# Patient Record
Sex: Female | Born: 2000 | Race: Black or African American | Hispanic: No | Marital: Single | Smoking: Never smoker
Health system: Southern US, Community
[De-identification: ages and names within clinical notes are randomized; demographics above are authoritative.]

## PROBLEM LIST (undated history)

## (undated) DIAGNOSIS — S0300XA Dislocation of jaw, unspecified side, initial encounter: Secondary | ICD-10-CM

## (undated) DIAGNOSIS — L659 Nonscarring hair loss, unspecified: Secondary | ICD-10-CM

## (undated) DIAGNOSIS — D649 Anemia, unspecified: Secondary | ICD-10-CM

## (undated) DIAGNOSIS — G43909 Migraine, unspecified, not intractable, without status migrainosus: Secondary | ICD-10-CM

## (undated) DIAGNOSIS — A389 Scarlet fever, uncomplicated: Secondary | ICD-10-CM

## (undated) DIAGNOSIS — K219 Gastro-esophageal reflux disease without esophagitis: Secondary | ICD-10-CM

## (undated) HISTORY — PX: TONSILLECTOMY: SUR1361

## (undated) HISTORY — DX: Nonscarring hair loss, unspecified: L65.9

## (undated) HISTORY — DX: Migraine, unspecified, not intractable, without status migrainosus: G43.909

## (undated) HISTORY — DX: Scarlet fever, uncomplicated: A38.9

## (undated) HISTORY — DX: Dislocation of jaw, unspecified side, initial encounter: S03.00XA

## (undated) HISTORY — DX: Gastro-esophageal reflux disease without esophagitis: K21.9

---
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
## 2022-01-17 ENCOUNTER — Other Ambulatory Visit: Payer: Self-pay

## 2022-01-17 ENCOUNTER — Inpatient Hospital Stay (HOSPITAL_COMMUNITY)
Admission: AD | Admit: 2022-01-17 | Discharge: 2022-01-17 | Disposition: A | Discharge status: Home / Self Care | Payer: Medicaid Other | Attending: Obstetrics and Gynecology | Admitting: Obstetrics and Gynecology

## 2022-01-17 DIAGNOSIS — O98812 Other maternal infectious and parasitic diseases complicating pregnancy, second trimester: Secondary | ICD-10-CM | POA: Diagnosis present

## 2022-01-17 DIAGNOSIS — O26892 Other specified pregnancy related conditions, second trimester: Secondary | ICD-10-CM | POA: Diagnosis not present

## 2022-01-17 DIAGNOSIS — Z3A18 18 weeks gestation of pregnancy: Secondary | ICD-10-CM | POA: Insufficient documentation

## 2022-01-17 DIAGNOSIS — B3731 Acute candidiasis of vulva and vagina: Secondary | ICD-10-CM | POA: Insufficient documentation

## 2022-01-17 DIAGNOSIS — R300 Dysuria: Secondary | ICD-10-CM | POA: Diagnosis not present

## 2022-01-17 LAB — URINALYSIS, ROUTINE W REFLEX MICROSCOPIC
Bilirubin Urine: NEGATIVE
Glucose, UA: NEGATIVE mg/dL
Hgb urine dipstick: NEGATIVE
Ketones, ur: NEGATIVE mg/dL
Nitrite: NEGATIVE
Protein, ur: NEGATIVE mg/dL
Specific Gravity, Urine: 1.018 (ref 1.005–1.030)
pH: 7 (ref 5.0–8.0)

## 2022-01-17 LAB — WET PREP, GENITAL
Clue Cells Wet Prep HPF POC: NONE SEEN
Sperm: NONE SEEN
Trich, Wet Prep: NONE SEEN
WBC, Wet Prep HPF POC: >=10 — AB (ref <10)

## 2022-01-17 MED ORDER — TERCONAZOLE 0.4 % VA CREA: 1.0000 | TOPICAL_CREAM | Freq: Every day | VAGINAL | 0 refills | Status: DC

## 2022-01-17 NOTE — Discharge Instructions (Signed)
[facility]  ? ?[facility]             ?[address], [place], [place] [address] ?[phone] ? ?[facility]                                                             ?[address], [place], [place], [address] ?[phone] ? ?[facility]  [place]                                    ?[place], [place], [place], [place], [address] ?[phone] ? ?[facility] at [facility] ?[address] [place], [place], [place], [address] ?[phone] ? ?[facility] at [facility]                                 ?[place], [place], [place], [address] ?[phone] ? ?[facility]                                    ?[address], [place], [place], [address] ?[phone] ? ?[facility] at [facility] ?[address], [address], [place], [place], [address]                             ?

## 2022-01-17 NOTE — MAU Note (Signed)
Tricia Welch is a 21 y.o. at [redacted]w[redacted]d here in MAU reporting: c/o vaginal itching & discharge.  Reports symptoms began early this week on Tuesday.  Reports had +Chlamydia last month hasn't had TOC.  Also states having pain with urination. ? ?Onset of complaint: 3 days ago ?Pain score: 3 ?Vitals:  ? 01/17/22 1507  ?BP: 107/60  ?Pulse: 88  ?Temp: 97.9 ?F (36.6 ?C)  ?SpO2: 100%  ?   ?FHT:169 bpm, denies VB or LOF ?Lab orders placed from triage:   U/A ?

## 2022-01-17 NOTE — MAU Provider Note (Signed)
MAU Provider Note ? ?History  ?  ? ?CSN: [id number] ? ?Arrival date and time: 01/17/22 1433 ? ? ?Chief Complaint  ?Patient presents with  ? Vaginal Discharge  ? Vaginal Itching  ? ?Tricia Welch is an 21 year old female presenting for evaluation of vaginal discharge, itching, and dysuria. ? ?Symptoms have been occurring for approximately 1 week. Describes the dysuria as a "slight discomfort" whenever urine hits her vaginal wall.  Otherwise she denies any urinary frequency, urgency, CVA tenderness, abdominal pain, or fever.  Vaginal discharge is clear to white and increased amounts compared to previous.  She tested positive for chlamydia in January and has not had a test of cure, but was treated while in the MAU.  She has not been sexually active since that time. ? ?She also reports that she needs an OB/GYN provider.  She recently moved here from [place].  She denies any leakage of fluid, vaginal bleeding, or abdominal pain. ? ?No past medical history on file. ? ?Past Surgical History:  ?Procedure Laterality Date  ? TONSILLECTOMY    ? ? ?Allergies: No Known Allergies ? ?Review of Systems  ?Constitutional:  Negative for fatigue and fever.  ?Respiratory:  Negative for shortness of breath.   ?Cardiovascular:  Negative for chest pain.  ?Gastrointestinal:  Negative for abdominal pain, constipation, diarrhea, nausea and vomiting.  ?Genitourinary:  Positive for dysuria and vaginal discharge. Negative for decreased urine volume, difficulty urinating, flank pain, frequency, hematuria, pelvic pain and vaginal bleeding.  ?Musculoskeletal:  Negative for back pain.  ?Skin:  Negative for pallor and rash.  ?Neurological:  Negative for dizziness and light-headedness.  ?Psychiatric/Behavioral:  Negative for behavioral problems.   ?Physical Exam  ? ?Blood pressure 107/60, pulse 88, temperature 97.9 ?F (36.6 ?C), temperature source Oral, height 5\' 8"  (1.727 m), weight 66.9 kg, last menstrual period 09/08/2021, SpO2 100 %. ? ?Physical  Exam ?Constitutional:   ?   General: She is not in acute distress. ?   Appearance: Normal appearance. She is not ill-appearing or diaphoretic.  ?HENT:  ?   Nose: Nose normal.  ?   Mouth/Throat:  ?   Mouth: Mucous membranes are moist.  ?Eyes:  ?   Extraocular Movements: Extraocular movements intact.  ?Cardiovascular:  ?   Rate and Rhythm: Normal rate.  ?Pulmonary:  ?   Effort: Pulmonary effort is normal.  ?Abdominal:  ?   Comments: Soft, nondistended, nontender to palpation throughout.  No suprapubic tenderness.  Gravid approximately around 18 weeks immediately underneath umbilicus.  ?Genitourinary: ?   Comments: Self swabbed  ?Musculoskeletal:  ?   Comments: Normal gait  ?Skin: ?   General: Skin is warm.  ?   Capillary Refill: Capillary refill takes less than 2 seconds.  ?Neurological:  ?   Mental Status: She is alert and oriented to person, place, and time.  ?Psychiatric:     ?   Behavior: Behavior normal.  ? ? ?MAU Course  ? ?MDM ?Wet prep positive for yeast ?GC/CH collected and pending ?UA with small leukocytes and few bacteria ? ?Assessment and Plan  ? ?1. Yeast vaginitis  Dysuria during pregnancy in second trimester ?Wet prep positive for yeast and consistent with symptoms.  Suspect dysuria is related to vaginal irritation rather than UTI, however will send culture.  GC/CH collected and pending.  Rx'd Terazol.  ? ?2. [redacted] weeks gestation of pregnancy ?Provided with a list of OB/GYN providers in [place] that accept medicaid.  ? ?Discharged home in stable condition.  MAU precautions discussed. ? ?Allayne Stack ?01/17/2022, 3:59 PM  ?

## 2022-01-19 LAB — CULTURE, OB URINE: Culture: >=100000 — AB

## 2022-01-20 LAB — GC/CHLAMYDIA PROBE AMP (~~LOC~~) NOT AT ARMC
Chlamydia: NEGATIVE
Comment: NEGATIVE
Comment: NORMAL
Neisseria Gonorrhea: NEGATIVE

## 2022-02-04 ENCOUNTER — Telehealth (INDEPENDENT_AMBULATORY_CARE_PROVIDER_SITE_OTHER): Payer: Medicaid Other

## 2022-02-04 DIAGNOSIS — Z3480 Encounter for supervision of other normal pregnancy, unspecified trimester: Secondary | ICD-10-CM | POA: Insufficient documentation

## 2022-02-04 DIAGNOSIS — Z3A00 Weeks of gestation of pregnancy not specified: Secondary | ICD-10-CM

## 2022-02-04 MED ORDER — BLOOD PRESSURE MONITORING DEVI: 1.0000 | 0 refills | Status: DC

## 2022-02-04 NOTE — Patient Instructions (Addendum)
Safe Medications in Pregnancy  ? ?Acne:  ?Benzoyl Peroxide  ?Salicylic Acid  ? ?Backache/Headache:  ?Tylenol: 2 regular strength every 4 hours OR  ?             2 Extra strength every 6 hours  ? ?Colds/Coughs/Allergies:  ?Benadryl (alcohol free) 25 mg every 6 hours as needed  ?Breath right strips  ?Claritin  ?Cepacol throat lozenges  ?Chloraseptic throat spray  ?Cold-Eeze- up to three times per day  ?Cough drops, alcohol free  ?Flonase (by prescription only)  ?Guaifenesin  ?Mucinex  ?Robitussin DM (plain only, alcohol free)  ?Saline nasal spray/drops  ?Sudafed (pseudoephedrine) & Actifed * use only after [redacted] weeks gestation and if you do not have high blood pressure  ?Tylenol  ?Vicks Vaporub  ?Zinc lozenges  ?Zyrtec  ? ?Constipation:  ?Colace  ?Ducolax suppositories  ?Fleet enema  ?Glycerin suppositories  ?Metamucil  ?Milk of magnesia  ?Miralax  ?Senokot  ?Smooth move tea  ? ?Diarrhea:  ?Kaopectate  ?Imodium A-D  ? ?*NO pepto Bismol  ? ?Hemorrhoids:  ?Anusol  ?Anusol HC  ?Preparation H  ?Tucks  ? ?Indigestion:  ?Tums  ?Maalox  ?Mylanta  ?Zantac  ?Pepcid  ? ?Insomnia:  ?Benadryl (alcohol free) 25mg every 6 hours as needed  ?Tylenol PM  ?Unisom, no Gelcaps  ? ?Leg Cramps:  ?Tums  ?MagGel  ? ?Nausea/Vomiting:  ?Bonine  ?Dramamine  ?Emetrol  ?Ginger extract  ?Sea bands  ?Meclizine  ?Nausea medication to take during pregnancy:  ?Unisom (doxylamine succinate 25 mg tablets) Take one tablet daily at bedtime. If symptoms are not adequately controlled, the dose can be increased to a maximum recommended dose of two tablets daily (1/2 tablet in the morning, 1/2 tablet mid-afternoon and one at bedtime).  ?Vitamin B6 100mg tablets. Take one tablet twice a day (up to 200 mg per day).  ? ?Skin Rashes:  ?Aveeno products  ?Benadryl cream or 25mg every 6 hours as needed  ?Calamine Lotion  ?1% cortisone cream  ? ?Yeast infection:  ?Gyne-lotrimin 7  ?Monistat 7  ? ? ?**If taking multiple medications, please check labels to avoid  duplicating the same active ingredients  ?**take medication as directed on the label  ?** Do not exceed 4000 mg of tylenol in 24 hours  ?**Do not take medications that contain aspirin or ibuprofen  ?    ?   ? ?AREA PEDIATRIC/FAMILY PRACTICE PHYSICIANS ? ?[place] ([address]) ?[facility] ?Chambliss, MD; Eniola, MD; Hale, MD; Hensel, MD; McDiarmid, MD; McIntyer, MD; Embry Manrique, MD; Walden, MD ?[address] [place], [place] [address] ?[phone] ?Mon-Fri 8:30-12:30, 1:30-5:00 ?Providers come to see babies at [facility] ?Accepting Medicaid ?[facility] ?Limited providers who accept newborns: Koirala, MD; Morrow, MD; Wolters, MD ?[address], [place], [place] [address] ?[phone] ?Mon-Fri 8:00-5:30 ?Babies seen by providers at [facility] ?Does NOT accept Medicaid ?Please call early in hospitalization for appointment (limited availability)  ?[facility] ?Mulberry, MD ?[address] [place], [place] [address] ?[phone] ?Mon, Tue, Thur, Fri 8:30-5:00, Wed 10:00-7:00 (closed 1-2pm) ?Babies seen by [facility] providers ?Accepting Medicaid ?[facility] ?Rubin, MD ?[address], [place],  [address] ?[phone] ?Mon-Fri 8:30-5:00, Sat 8:30-12:00 ?Provider comes to see babies at [facility] ?Accepting Medicaid ?Must have been referred from current patients or contacted office prior to delivery ?[facility] ([facility]) ?Brown, MD; Chandler, MD; Ettefagh,   MD; Grant, MD; Lester, MD; McCormick, MD; McQueen, MD; Prose, MD; Simha, MD; Stanley, MD; Stryffeler, NP; Tebben, NP ?[address], [place], [place] [address] ?[phone] ?Mon, Tue, Thur, Fri 8:30-5:30, Wed 9:30-5:30, Sat 8:30-12:30 ?Babies seen by [facility] providers ?Accepting Medicaid ?Only accepting infants of first-time parents or  siblings of current patients ?Hospital discharge coordinator will make follow-up appointment ?Jack Amos ?[address], [place], [place]  [address] ?[phone]   Fax - [phone] ?[facility] ?[address], [place], [place]  [address] ?Phone - [phone]   Fax - [phone] ?Shilpa Gosrani ?[address], [place], [place]  [address] ?[phone] ? ?[place] ([address]) ?[facility] ?Bates, MD; [place]assfield, MD; Cooper, Cox, MD; MD; Davis, MD; Dovico, MD; Ettefaugh, MD; Little, MD; Lowe, MD; Keiffer, MD; Melvin, MD; Sumner, MD; Williams, MD ?[address], [place], [place] [address] ?[phone] ?Mon-Fri 8:30-5:00 (extended evenings Mon-Thur as needed), Sat-Sun 10:00-1:00 ?Providers come to see babies at [facility] ?Accepting Medicaid for families of first-time babies and families with all children in the household age 3 and under. Must register with office prior to making appointment (M-F only). ?[facility] ?Henson, NP; Knapp, MD; Lalonde, MD; Tysinger, PA ?[address] [place], [place] [address] ?[phone] ?Mon-Fri 8:00-5:00 ?Babies seen by providers at [facility] ?Does NOT accept Medicaid/Commercial Insurance Only ?[facility] ([facility])  ?Artis, MD; Barnes, MD; Bratton, MD; Coccaro, MD; Lockett Gardner, MD; Kramer, MD; Marshall, MD; Netherton, MD; Poleto, MD; Skinner, MD ?[address] [place], [place] [address] ?[phone] ?Mon-Fri 8:30-5:30, Sat (Oct.-Mar.) 9:00-1:00 ?Babies seen by providers at [facility] ?Accepting Medicaid ? ?[place] ([address]) ?[facility] ?Reid, MD; Warner, MD ?[address], [place], [place] [address] ?[phone] ?Mon-Fri 8:30-5:00, Sat 8:30-12:00 ?Providers come to see babies at [facility] ?Does NOT accept Medicaid ?[facility] ?Becker, PA; Hagler, MD; Scifres, PA; Sun,  MD; Swayne, MD ?[address], [place], [place] [address] ?[phone] ?Mon-Fri 8:00-5:00 ?Babies seen by providers at [facility] ?Does NOT accept Medicaid ?Only accepting babies of parents who are patients ?Please call early in hospitalization for appointment (limited availability) ?[facility] ?Clark, MD; Frye, MD; Kelleher, MD; Mack, NP; Miller, MD; O'Keller, MD; Patterson, NP; Pudlo, MD; Puzio, MD; Thomas, MD; Tucker, MD; Twiselton, MD ?[address], [place], [place] [address] ?[phone] ?Mon-Fri 8:00-5:00, Sat 9:00-12:00 ?Providers come to see babies at [facility] ?Does NOT accept Medicaid ? ?[place] ([id number]) ?[facility] ?Limited providers accepting new patients: Brake, NP; Wharton, PA ?[address], [place], [place] [address] ?[phone] ?Mon-Fri 8:00-5:00 ?Babies seen by providers at [facility] ?Does NOT accept Medicaid ?Only accepting babies of parents who are patients ?Please call early in hospitalization for appointment (limited availability) ?[facility] ?Gay, MD; Quinlan, MD ?[address] [place], [place] [address] ?[phone] (press [phone] to schedule appointment) ?Mon-Fri 8:00-5:00 ?Providers come to see babies at [facility] ?Does NOT accept Medicaid ?[facility] ?Mazer, MD ?[address] [place], [place] [address] ?[phone] ?Mon-Fri 8:30-5:00 (lunch 12:30-1:00), extended hours by appointment only Wed 5:00-6:30 ?Babies seen by [facility] providers ?Accepting Medicaid ?[facility] ?Banks, MD; Jordan, MD; Koberlein, MD ?[address], ,  [address] ?[phone] ?Mon-Fri 8:00-5:00 ?Babies seen by [facility] providers ?Does NOT accept Medicaid ?[facility] ?Parker, MD; Hunter, MD; Wallace, DO ?[address]   [place], [place] [address] ?[phone] ?Mon-Fri 8:00-5:00 ?Babies seen by [facility] providers ?Does NOT accept Medicaid ?[facility] ?Brandon, PA; Brecken, PA; Christy, NP; Dees, MD; DeClaire, MD; DeWeese, MD; Hansen, NP; Mills, NP; Parrish, NP; Smoot, NP; Summer, MD; Vapne, M

## 2022-02-04 NOTE — Progress Notes (Signed)
New OB Intake ? ?I connected with  Tricia Welch on 02/04/22 at  3:15 PM EDT by MyChart Video Visit and verified that I am speaking with the correct person using two identifiers. Nurse is located at [facility] and pt is located at [employer]. ? ?I discussed the limitations, risks, security and privacy concerns of performing an evaluation and management service by telephone and the availability of in person appointments. I also discussed with the patient that there may be a patient responsible charge related to this service. The patient expressed understanding and agreed to proceed. ? ?I explained I am completing New OB Intake today. We discussed her EDD of 06/15/22 that is based on LMP of 09/08/21. Pt is G1/P0. I reviewed her allergies, medications, Medical/Surgical/OB history, and appropriate screenings. I informed her of [facility] services. Based on history, this is a/an  pregnancy uncomplicated .  ? ?There are no problems to display for this patient. ? ? ?Concerns addressed today ? ?Delivery Plans:  ?Plans to deliver at [facility].  ? ?MyChart/Babyscripts ?MyChart access verified. I explained pt will have some visits in office and some virtually. Babyscripts instructions given and order placed. Patient verifies receipt of registration text/e-mail. Account successfully created and app downloaded. ? ?Blood Pressure Cuff  ?Blood pressure cuff ordered for patient to pick-up from [employer]. Explained after first prenatal appt pt will check weekly and document in 45. ? ?Weight scale: Patient does / does not  have weight scale. Weight scale ordered for patient to pick up from [employer].  ? ?Anatomy US ?Explained first scheduled [place] will be around 19 weeks. Anatomy US scheduled for 02/26/22 at [place]. Pt notified to arrive at 0730. ?Scheduled AFP lab only appointment if CenteringPregnancy pt for same day as anatomy US.  ? ?Labs ?Discussed Johnsie Cancel genetic screening with patient. Would like both Panorama and Horizon drawn  at new OB visit.Also if interested in genetic testing, tell patient she will need AFP 15-21 weeks to complete genetic testing .Routine prenatal labs needed. ? ?Covid Vaccine ?Patient has covid vaccine.  ? ?Is patient a CenteringPregnancy candidate? Not a candidate  "Centering Patient" indicated on sticky note ?  ?Is patient a Mom+Baby Combined Care candidate? Not a candidate   Scheduled with Mom+Baby provider  ?  ?Is patient interested in [place]? No  "Interested in [employer] - Schedule next visit with CNM" on sticky note ? ?Informed patient of [facility] website  and placed link in her AVS.  ? ?Social Determinants of Health ?Food Insecurity: Patient denies food insecurity. ?WIC Referral: Patient is interested [facility] to [facility].  ?Transportation: Patient denies transportation needs. ?Childcare: Discussed no children allowed at ultrasound appointments. Offered childcare services; patient declines childcare services at this time. ? ?Send link to Pregnancy Navigators ? ? ?Placed OB Box on problem list and updated ? ?First visit review ?I reviewed new OB appt with pt. I explained she will have a pelvic exam, ob bloodwork with genetic screening, and PAP smear. Explained pt will be seen by Dr.Duncan at first visit; encounter routed to appropriate provider. Explained that patient will be seen by pregnancy navigator following visit with provider. [facility] information placed in AVS.  ? ?Bethanne Ginger, CMA ?02/04/2022  3:07 PM  ?

## 2022-02-24 NOTE — Progress Notes (Signed)
? ?History:  ? Tricia Welch is a 21 y.o. G1P0 at [redacted]w[redacted]d by L=9 being seen today for her first obstetrical visit.   Patient does intend to breast feed.  ? ?No pregnancy history.  ? ?Patient reports no complaints. ?  ? ?  ?HISTORY: ?OB History  ?Gravida Para Term Preterm AB Living  ?1 0 0 0 0 0  ?SAB IAB Ectopic Multiple Live Births  ?0 0 0 0 0  ?  ?# Outcome Date GA Lbr Len/2nd Weight Sex Delivery Anes PTL Lv  ?1 Current           ?  ? ?History reviewed. No pertinent past medical history. ?Past Surgical History:  ?Procedure Laterality Date  ? TONSILLECTOMY    ? ?Family History  ?Problem Relation Age of Onset  ? Diabetes Maternal Grandmother   ? Diabetes Maternal Grandfather   ? Diabetes Paternal Grandmother   ? Diabetes Paternal Grandfather   ? ?Social History  ? ?Tobacco Use  ? Smoking status: Never  ?  Passive exposure: Never  ? Smokeless tobacco: Never  ?Vaping Use  ? Vaping Use: Never used  ?Substance Use Topics  ? Alcohol use: Never  ? Drug use: Never  ? ?No Known Allergies ?Current Outpatient Medications on File Prior to Visit  ?Medication Sig Dispense Refill  ? Blood Pressure Monitoring DEVI 1 each by Does not apply route once a week. 1 each 0  ? Prenatal Vit-Fe Fumarate-FA (MULTIVITAMIN-PRENATAL) 27-0.8 MG TABS tablet Take 1 tablet by mouth daily at 12 noon.    ? ?No current facility-administered medications on file prior to visit.  ? ? ?Review of Systems ?Pertinent items noted in HPI and remainder of comprehensive ROS otherwise negative. ? ?Physical Exam:  ? ?Vitals:  ? 02/28/22 1042  ?BP: 109/75  ?Pulse: (!) 114  ?Weight: 153 lb (69.4 kg)  ? ?Fetal Heart Rate (bpm): 166 ? ? ?General: well-developed, well-nourished female in no acute distress  ?   ?Skin: normal coloration and turgor, no rashes  ?Neurologic: oriented, normal, negative, normal mood  ?Extremities: normal strength, tone, and muscle mass, ROM of all joints is normal  ?HEENT PERRLA, extraocular movement intact and sclera clear, anicteric  ?Neck  supple and no masses  ?Cardiovascular: regular rate and rhythm  ?Respiratory:  no respiratory distress, normal breath sounds  ?Abdomen: soft, non-tender; bowel sounds normal; no masses,  no organomegaly  ?   ?  ?Assessment:  ?  ?Pregnancy: G1P0 ?Patient Active Problem List  ? Diagnosis Date Noted  ? Supervision of other normal pregnancy, antepartum 02/04/2022  ? Intractable migraine without aura and with status migrainosus 03/08/2020  ? ?  ?Plan:  ?  ?1. Supervision of other normal pregnancy, antepartum ?Initial labs drawn. Discussed 2 hr gtt next time.  ?Repeat Ucx - prior was lactobacilli ?Continue prenatal vitamins. ?Problem list reviewed and updated. ?Genetic Screening discussed, First trimester screen, Quad screen, and NIPS: desires and ordered. ?Ultrasound discussed; fetal anatomic survey:  done 4/26  - complete and normal. ?Anticipatory guidance about prenatal visits given including labs, ultrasounds, and testing. ?Discussed usage of Babyscripts and virtual visits ? ?The nature of [place] [facility]/Faculty Practice with multiple MDs and Advanced Practice Providers was explained to patient; also emphasized that residents, students are part of our team. ?Routine obstetric precautions reviewed. Encouraged to seek out care at office or emergency room [facility] MAU preferred) for urgent and/or emergent concerns. ?Return in about 4 weeks (around 03/28/2022) for OB VISIT,  MD or APP, 2 hr GTT.  ?  ?Milas Hock, MD, FACOG ?Obstetrician Heritage manager, Faculty Practice ?Center for [employer], [facility] ? ?

## 2022-02-26 ENCOUNTER — Encounter: Payer: Self-pay | Admitting: *Deleted

## 2022-02-26 ENCOUNTER — Other Ambulatory Visit: Payer: Self-pay | Admitting: Obstetrics and Gynecology

## 2022-02-26 ENCOUNTER — Ambulatory Visit: Payer: Medicaid Other | Admitting: *Deleted

## 2022-02-26 ENCOUNTER — Ambulatory Visit: Payer: Medicaid Other | Attending: Obstetrics and Gynecology

## 2022-02-26 VITALS — BP 106/65 | HR 88

## 2022-02-26 DIAGNOSIS — O09892 Supervision of other high risk pregnancies, second trimester: Secondary | ICD-10-CM | POA: Insufficient documentation

## 2022-02-26 DIAGNOSIS — Z3480 Encounter for supervision of other normal pregnancy, unspecified trimester: Secondary | ICD-10-CM

## 2022-02-26 DIAGNOSIS — Z3A24 24 weeks gestation of pregnancy: Secondary | ICD-10-CM | POA: Insufficient documentation

## 2022-02-26 DIAGNOSIS — Z363 Encounter for antenatal screening for malformations: Secondary | ICD-10-CM | POA: Diagnosis present

## 2022-02-28 ENCOUNTER — Encounter: Payer: Self-pay | Admitting: Obstetrics and Gynecology

## 2022-02-28 ENCOUNTER — Ambulatory Visit (INDEPENDENT_AMBULATORY_CARE_PROVIDER_SITE_OTHER): Payer: Medicaid Other | Admitting: Obstetrics and Gynecology

## 2022-02-28 VITALS — BP 109/75 | HR 114 | Wt 153.0 lb

## 2022-02-28 DIAGNOSIS — Z3480 Encounter for supervision of other normal pregnancy, unspecified trimester: Secondary | ICD-10-CM | POA: Diagnosis not present

## 2022-02-28 DIAGNOSIS — O99012 Anemia complicating pregnancy, second trimester: Secondary | ICD-10-CM

## 2022-03-01 LAB — CBC/D/PLT+RPR+RH+ABO+RUBIGG...
Antibody Screen: NEGATIVE
Basophils Absolute: 0 10*3/uL (ref 0.0–0.2)
Basos: 0 %
EOS (ABSOLUTE): 0.1 10*3/uL (ref 0.0–0.4)
Eos: 2 %
HCV Ab: NONREACTIVE
HIV Screen 4th Generation wRfx: NONREACTIVE
Hematocrit: 29.8 % — ABNORMAL LOW (ref 34.0–46.6)
Hemoglobin: 10.3 g/dL — ABNORMAL LOW (ref 11.1–15.9)
Hepatitis B Surface Ag: NEGATIVE
Immature Grans (Abs): 0 10*3/uL (ref 0.0–0.1)
Immature Granulocytes: 1 %
Lymphocytes Absolute: 1.9 10*3/uL (ref 0.7–3.1)
Lymphs: 33 %
MCH: 30.9 pg (ref 26.6–33.0)
MCHC: 34.6 g/dL (ref 31.5–35.7)
MCV: 90 fL (ref 79–97)
Monocytes Absolute: 0.5 10*3/uL (ref 0.1–0.9)
Monocytes: 8 %
Neutrophils Absolute: 3.1 10*3/uL (ref 1.4–7.0)
Neutrophils: 56 %
Platelets: 179 10*3/uL (ref 150–450)
RBC: 3.33 x10E6/uL — ABNORMAL LOW (ref 3.77–5.28)
RDW: 12.5 % (ref 11.7–15.4)
RPR Ser Ql: NONREACTIVE
Rh Factor: POSITIVE
Rubella Antibodies, IGG: 1.58 index (ref >0.99)
WBC: 5.6 10*3/uL (ref 3.4–10.8)

## 2022-03-01 LAB — HCV INTERPRETATION

## 2022-03-01 LAB — HEMOGLOBIN A1C
Est. average glucose Bld gHb Est-mCnc: 94 mg/dL
Hgb A1c MFr Bld: 4.9 % (ref 4.8–5.6)

## 2022-03-03 DIAGNOSIS — O99012 Anemia complicating pregnancy, second trimester: Secondary | ICD-10-CM | POA: Insufficient documentation

## 2022-03-03 MED ORDER — FERROUS SULFATE 325 (65 FE) MG PO TABS: 325.0000 mg | ORAL_TABLET | ORAL | 3 refills | Status: DC

## 2022-03-03 NOTE — Addendum Note (Signed)
Addended by: Radene Gunning A on: 03/03/2022 08:03 AM ? ? Modules accepted: Orders ? ?

## 2022-03-04 ENCOUNTER — Encounter: Payer: Self-pay | Admitting: *Deleted

## 2022-03-19 ENCOUNTER — Encounter: Payer: Self-pay | Admitting: *Deleted

## 2022-03-20 ENCOUNTER — Encounter: Payer: Self-pay | Admitting: Obstetrics and Gynecology

## 2022-03-20 ENCOUNTER — Other Ambulatory Visit: Payer: Self-pay

## 2022-03-20 DIAGNOSIS — O219 Vomiting of pregnancy, unspecified: Secondary | ICD-10-CM

## 2022-03-20 MED ORDER — PROMETHAZINE HCL 25 MG PO TABS: 25.0000 mg | ORAL_TABLET | Freq: Four times a day (QID) | ORAL | 0 refills | Status: DC | PRN

## 2022-03-31 ENCOUNTER — Other Ambulatory Visit: Payer: Self-pay

## 2022-03-31 DIAGNOSIS — Z3480 Encounter for supervision of other normal pregnancy, unspecified trimester: Secondary | ICD-10-CM

## 2022-04-01 ENCOUNTER — Other Ambulatory Visit: Payer: Medicaid Other

## 2022-04-01 ENCOUNTER — Ambulatory Visit (INDEPENDENT_AMBULATORY_CARE_PROVIDER_SITE_OTHER): Payer: Medicaid Other | Admitting: Family Medicine

## 2022-04-01 ENCOUNTER — Encounter: Payer: Self-pay | Admitting: Family Medicine

## 2022-04-01 VITALS — BP 108/76 | HR 100 | Wt 158.0 lb

## 2022-04-01 DIAGNOSIS — Z3A29 29 weeks gestation of pregnancy: Secondary | ICD-10-CM

## 2022-04-01 DIAGNOSIS — O99012 Anemia complicating pregnancy, second trimester: Secondary | ICD-10-CM

## 2022-04-01 DIAGNOSIS — Z3480 Encounter for supervision of other normal pregnancy, unspecified trimester: Secondary | ICD-10-CM

## 2022-04-01 NOTE — Patient Instructions (Signed)
"  Belly band" or pregnancy tape to help with your belly.   Remember to set a time on your phone to take the iron pills.

## 2022-04-01 NOTE — Progress Notes (Signed)
    Subjective:  Tricia Welch is a 21 y.o. G1P0 at [redacted]w[redacted]d being seen today for ongoing prenatal care.  She is currently monitored for the following issues for this low-risk pregnancy and has Supervision of other normal pregnancy, antepartum; Intractable migraine without aura and with status migrainosus; and Anemia during pregnancy in second trimester on their problem list.  Patient reports no complaints.  Contractions: Not present. Movement: Present. Denies leaking of fluid.   The following portions of the patient's history were reviewed and updated as appropriate: allergies, current medications, past family history, past medical history, past social history, past surgical history and problem list.   Objective:   Vitals:   04/01/22 1006  BP: 108/76  Pulse: 100  Weight: 158 lb (71.7 kg)    Fetal Status: Fetal Heart Rate (bpm): 164 Fundal Height: 28 cm Movement: Present     General:  Alert, oriented and cooperative. Patient is in no acute distress.  Skin: Skin is warm and dry. No rash noted.   Cardiovascular: Normal heart rate noted  Respiratory: Normal respiratory effort, no problems with respiration noted  Abdomen: Soft, gravid, appropriate for gestational age. Pain/Pressure: Present     Pelvic:  Cervical exam deferred        Extremities: Normal range of motion.  Edema: None  Mental Status: Normal mood and affect. Normal behavior. Normal judgment and thought content.    Assessment and Plan:  Pregnancy: G1P0 at [redacted]w[redacted]d  1. Supervision of other normal pregnancy, antepartum Doing well with normal fetal movement. Encouraged belly band or pregnancy tape.   2. [redacted] weeks gestation of pregnancy Completing GTT/labs today. Declined Tdap, offer at next visit.   3. Anemia during pregnancy in second trimester Inconsistent with po iron (forgets), encouraging setting an alarm. Suspect hgb may be unchanged but check CBC today.   4. Previous abnormal urine culture History of lactobacillus. No  urine symptoms. Recollect today.   Preterm labor symptoms and general obstetric precautions including but not limited to vaginal bleeding, contractions, leaking of fluid and fetal movement were reviewed in detail with the patient. Please refer to After Visit Summary for other counseling recommendations.   Return in about 2 weeks (around 04/15/2022) for LROB.   Allayne Stack, DO

## 2022-04-02 LAB — CBC
Hematocrit: 31 % — ABNORMAL LOW (ref 34.0–46.6)
Hemoglobin: 10.2 g/dL — ABNORMAL LOW (ref 11.1–15.9)
MCH: 29 pg (ref 26.6–33.0)
MCHC: 32.9 g/dL (ref 31.5–35.7)
MCV: 88 fL (ref 79–97)
Platelets: 207 10*3/uL (ref 150–450)
RBC: 3.52 x10E6/uL — ABNORMAL LOW (ref 3.77–5.28)
RDW: 12.1 % (ref 11.7–15.4)
WBC: 6.7 10*3/uL (ref 3.4–10.8)

## 2022-04-02 LAB — GLUCOSE TOLERANCE, 2 HOURS W/ 1HR
Glucose, 1 hour: 120 mg/dL (ref 70–179)
Glucose, 2 hour: 116 mg/dL (ref 70–152)
Glucose, Fasting: 78 mg/dL (ref 70–91)

## 2022-04-02 LAB — RPR: RPR Ser Ql: NONREACTIVE

## 2022-04-02 LAB — HIV ANTIBODY (ROUTINE TESTING W REFLEX): HIV Screen 4th Generation wRfx: NONREACTIVE

## 2022-04-05 ENCOUNTER — Other Ambulatory Visit: Payer: Self-pay | Admitting: Family Medicine

## 2022-04-05 ENCOUNTER — Encounter: Payer: Self-pay | Admitting: Family Medicine

## 2022-04-05 DIAGNOSIS — R8271 Bacteriuria: Secondary | ICD-10-CM

## 2022-04-05 LAB — URINE CULTURE, OB REFLEX

## 2022-04-05 LAB — CULTURE, OB URINE

## 2022-04-05 LAB — OB RESULTS CONSOLE GBS: GBS: POSITIVE

## 2022-04-05 MED ORDER — CEFADROXIL 500 MG PO CAPS: 500.0000 mg | ORAL_CAPSULE | Freq: Two times a day (BID) | ORAL | 0 refills | Status: DC

## 2022-04-08 ENCOUNTER — Inpatient Hospital Stay (HOSPITAL_COMMUNITY)
Admission: AD | Admit: 2022-04-08 | Discharge: 2022-04-08 | Disposition: A | Discharge status: Home / Self Care | Payer: Medicaid Other | Attending: Obstetrics and Gynecology | Admitting: Obstetrics and Gynecology

## 2022-04-08 DIAGNOSIS — B348 Other viral infections of unspecified site: Secondary | ICD-10-CM | POA: Diagnosis not present

## 2022-04-08 DIAGNOSIS — O98513 Other viral diseases complicating pregnancy, third trimester: Secondary | ICD-10-CM | POA: Diagnosis not present

## 2022-04-08 DIAGNOSIS — O26893 Other specified pregnancy related conditions, third trimester: Secondary | ICD-10-CM | POA: Diagnosis present

## 2022-04-08 DIAGNOSIS — Z3A30 30 weeks gestation of pregnancy: Secondary | ICD-10-CM | POA: Insufficient documentation

## 2022-04-08 LAB — RESPIRATORY PANEL BY PCR

## 2022-04-08 LAB — URINALYSIS, ROUTINE W REFLEX MICROSCOPIC
Bilirubin Urine: NEGATIVE
Glucose, UA: NEGATIVE mg/dL
Hgb urine dipstick: NEGATIVE
Ketones, ur: NEGATIVE mg/dL
Nitrite: NEGATIVE
Protein, ur: 30 mg/dL — AB
Specific Gravity, Urine: 1.021 (ref 1.005–1.030)
WBC, UA: >50 WBC/hpf — ABNORMAL HIGH (ref 0–5)
pH: 6 (ref 5.0–8.0)

## 2022-04-08 LAB — GROUP A STREP BY PCR: Group A Strep by PCR: NOT DETECTED

## 2022-04-08 MED ORDER — ACETAMINOPHEN 500 MG PO TABS
1000.0000 mg | ORAL_TABLET | Freq: Once | ORAL | Status: AC
  Administered 2022-04-08: 1000 mg via ORAL
  Filled 2022-04-08: qty 2

## 2022-04-08 MED ORDER — LACTATED RINGERS IV BOLUS
1000.0000 mL | Freq: Once | INTRAVENOUS | Status: AC
  Administered 2022-04-08: 1000 mL via INTRAVENOUS

## 2022-04-08 NOTE — MAU Note (Signed)
Pt reports sinus, ear and throat pain that started Monday. States she has taken Tylenol earlier, without relief. Symptoms are worse since they started. She is able to drink fluids but states throat is very sore. Endorses good fetal movement, denies bleeding, LOF or other complaints.

## 2022-04-08 NOTE — MAU Note (Signed)
.  Tricia Welch is a 21 y.o. at [redacted]w[redacted]d here in MAU reporting sore throat, earache in both ears, headache, and face hurts since yesterday. Tylenol not helping. No fever or chills. Good FM. No pregnancy concerns.   Onset of complaint: Monday Pain score: 7 Vitals:   04/08/22 2032  BP: 107/71  Pulse: (!) 137  Resp: 17  Temp: 100.1 F (37.8 C)  SpO2: 99%     FHT:177 Lab orders placed from triage:  none

## 2022-04-08 NOTE — MAU Provider Note (Signed)
History     CSN: [id number]  Arrival date and time: 04/08/22 2009   Event Date/Time   First Provider Initiated Contact with Patient 04/08/22 2125      Chief Complaint  Patient presents with   Sore Throat        Otalgia   Headache   HPI  Tricia Welch is a 21 y.o. G1P0 at [redacted]w[redacted]d who presents for evaluation of cold symptoms. Patient reports sore throat, ear pain, headache and sinus pressure since yesterday. She reports tylenol is not helping. Denies any fever or chills. She denies any vaginal bleeding, discharge, and leaking of fluid. Denies any constipation, diarrhea or any urinary complaints. Reports normal fetal movement.   OB History     Gravida  1   Para      Term      Preterm      AB      Living         SAB      IAB      Ectopic      Multiple      Live Births              No past medical history on file.  Past Surgical History:  Procedure Laterality Date   TONSILLECTOMY      Family History  Problem Relation Age of Onset   Diabetes Maternal Grandmother    Diabetes Maternal Grandfather    Diabetes Paternal Grandmother    Diabetes Paternal Grandfather     Social History   Tobacco Use   Smoking status: Never    Passive exposure: Never   Smokeless tobacco: Never  Vaping Use   Vaping Use: Never used  Substance Use Topics   Alcohol use: Never   Drug use: Never    Allergies: No Known Allergies  Medications Prior to Admission  Medication Sig Dispense Refill Last Dose   Blood Pressure Monitoring DEVI 1 each by Does not apply route once a week. 1 each 0 Past Month   Prenatal Vit-Fe Fumarate-FA (MULTIVITAMIN-PRENATAL) 27-0.8 MG TABS tablet Take 1 tablet by mouth daily at 12 noon.   Past Week   promethazine (PHENERGAN) 25 MG tablet Take 1 tablet (25 mg total) by mouth every 6 (six) hours as needed for nausea or vomiting. 30 tablet 0 Past Week   cefadroxil (DURICEF) 500 MG capsule Take 1 capsule (500 mg total) by mouth 2 (two) times daily  for 5 days. 10 capsule 0    ferrous sulfate (FERROUSUL) 325 (65 FE) MG tablet Take 1 tablet (325 mg total) by mouth every other day. 30 tablet 3     Review of Systems  Constitutional: Negative.  Negative for fatigue and fever.  HENT:  Positive for congestion, sinus pressure and sore throat.   Respiratory:  Positive for cough. Negative for shortness of breath.   Cardiovascular: Negative.  Negative for chest pain.  Gastrointestinal: Negative.  Negative for abdominal pain, constipation, diarrhea, nausea and vomiting.  Genitourinary: Negative.  Negative for dysuria, vaginal bleeding and vaginal discharge.  Neurological: Negative.  Negative for dizziness and headaches.  Physical Exam   Blood pressure 99/70, pulse (!) 134, temperature 99.1 F (37.3 C), temperature source Oral, resp. rate 18, height 5\' 8"  (1.727 m), weight 73.5 kg, last menstrual period 09/08/2021, SpO2 99 %.  Patient Vitals for the past 24 hrs:  BP Temp Temp src Pulse Resp SpO2 Height Weight  04/08/22 2115 99/70 99.1 F (37.3 C) Oral (!) 134 18 -- -- --  04/08/22 2032 107/71 100.1 F (37.8 C) -- (!) 137 17 99 % 5\' 8"  (1.727 m) 73.5 kg    Physical Exam Vitals and nursing note reviewed.  Constitutional:      General: She is not in acute distress.    Appearance: She is well-developed.  HENT:     Head: Normocephalic.  Eyes:     Pupils: Pupils are equal, round, and reactive to light.  Cardiovascular:     Rate and Rhythm: Normal rate and regular rhythm.     Heart sounds: Normal heart sounds.  Pulmonary:     Effort: Pulmonary effort is normal. No respiratory distress.     Breath sounds: Normal breath sounds.  Abdominal:     General: Bowel sounds are normal. There is no distension.     Palpations: Abdomen is soft.     Tenderness: There is no abdominal tenderness.  Skin:    General: Skin is warm and dry.  Neurological:     Mental Status: She is alert and oriented to person, place, and time.  Psychiatric:        Mood  and Affect: Mood normal.        Behavior: Behavior normal.        Thought Content: Thought content normal.        Judgment: Judgment normal.   Fetal Tracing:  Baseline: 150 Variability: moderate Accels: 15x15 Decels: none  Toco: none  MAU Course  Procedures  Results for orders placed or performed during the hospital encounter of 04/08/22 (from the past 24 hour(s))  Urinalysis, Routine w reflex microscopic Urine, Clean Catch     Status: Abnormal   Collection Time: 04/08/22  8:39 PM  Result Value Ref Range   Color, Urine YELLOW YELLOW   APPearance CLOUDY (A) CLEAR   Specific Gravity, Urine 1.021 1.005 - 1.030   pH 6.0 5.0 - 8.0   Glucose, UA NEGATIVE NEGATIVE mg/dL   Hgb urine dipstick NEGATIVE NEGATIVE   Bilirubin Urine NEGATIVE NEGATIVE   Ketones, ur NEGATIVE NEGATIVE mg/dL   Protein, ur 30 (A) NEGATIVE mg/dL   Nitrite NEGATIVE NEGATIVE   Leukocytes,Ua LARGE (A) NEGATIVE   RBC / HPF 0-5 0 - 5 RBC/hpf   WBC, UA >50 (H) 0 - 5 WBC/hpf   Bacteria, UA RARE (A) NONE SEEN   Squamous Epithelial / LPF 21-50 0 - 5   Mucus PRESENT      MDM Labs ordered and reviewed.   UA, UC LR bolus Tylenol PO Group A Strep Respiratory Panel  Assessment and Plan   1. Rhinovirus   2. [redacted] weeks gestation of pregnancy    -Discharge home in stable condition -Cold precautions discussed and OTC medications given -Patient advised to follow-up with OB as scheduled for prenatal care -Patient may return to MAU as needed or if her condition were to change or worsen  06/08/22, CNM 04/08/2022, 9:25 PM

## 2022-04-08 NOTE — Discharge Instructions (Signed)

## 2022-04-10 LAB — CULTURE, OB URINE: Culture: 3000 — AB

## 2022-04-18 ENCOUNTER — Encounter: Payer: Self-pay | Admitting: Obstetrics and Gynecology

## 2022-04-21 ENCOUNTER — Encounter: Payer: Self-pay | Admitting: Medical

## 2022-04-21 ENCOUNTER — Ambulatory Visit (INDEPENDENT_AMBULATORY_CARE_PROVIDER_SITE_OTHER): Payer: Medicaid Other | Admitting: Medical

## 2022-04-21 ENCOUNTER — Telehealth: Payer: Self-pay

## 2022-04-21 VITALS — BP 108/69 | HR 103 | Wt 160.9 lb

## 2022-04-21 DIAGNOSIS — Z3480 Encounter for supervision of other normal pregnancy, unspecified trimester: Secondary | ICD-10-CM

## 2022-04-21 DIAGNOSIS — Z3A32 32 weeks gestation of pregnancy: Secondary | ICD-10-CM | POA: Diagnosis not present

## 2022-04-21 DIAGNOSIS — R8271 Bacteriuria: Secondary | ICD-10-CM

## 2022-04-21 DIAGNOSIS — Z23 Encounter for immunization: Secondary | ICD-10-CM | POA: Diagnosis not present

## 2022-04-21 DIAGNOSIS — O99012 Anemia complicating pregnancy, second trimester: Secondary | ICD-10-CM

## 2022-04-21 NOTE — Progress Notes (Signed)
   PRENATAL VISIT NOTE  Subjective:  Tricia Welch is a 21 y.o. G1P0 at [redacted]w[redacted]d being seen today for ongoing prenatal care.  She is currently monitored for the following issues for this low-risk pregnancy and has Supervision of other normal pregnancy, antepartum; Intractable migraine without aura and with status migrainosus; Anemia during pregnancy in second trimester; and GBS bacteriuria on their problem list.  Patient reports intermittent LE edema, milk production.  Contractions: Irritability. Vag. Bleeding: None.  Movement: Present. Denies leaking of fluid.   The following portions of the patient's history were reviewed and updated as appropriate: allergies, current medications, past family history, past medical history, past social history, past surgical history and problem list.   Objective:   Vitals:   04/21/22 1528  BP: 108/69  Pulse: (!) 103  Weight: 160 lb 14.4 oz (73 kg)    Fetal Status: Fetal Heart Rate (bpm): 158 Fundal Height: 32 cm Movement: Present     General:  Alert, oriented and cooperative. Patient is in no acute distress.  Skin: Skin is warm and dry. No rash noted.   Cardiovascular: Normal heart rate noted  Respiratory: Normal respiratory effort, no problems with respiration noted  Abdomen: Soft, gravid, appropriate for gestational age.  Pain/Pressure: Present     Pelvic: Cervical exam deferred        Extremities: Normal range of motion.  Edema: Trace  Mental Status: Normal mood and affect. Normal behavior. Normal judgment and thought content.   Assessment and Plan:  Pregnancy: G1P0 at [redacted]w[redacted]d 1. Supervision of other normal pregnancy, antepartum - Tdap vaccine greater than or equal to 7yo IM - Peds list given and importance of choosing Peds prior to delivery discussed  - Discussed that some patient will have milk production prior to delivery, try not to promote milk with expression due to risk of preterm contractions  - Increased PO hydration and elevation of LE may  help with swelling, no pain noted today. Warning signs for DVT discussed   2. Anemia during pregnancy in second trimester - PO iron started 5/1  3. GBS bacteriuria - Treat in labor   4. [redacted] weeks gestation of pregnancy  Preterm labor symptoms and general obstetric precautions including but not limited to vaginal bleeding, contractions, leaking of fluid and fetal movement were reviewed in detail with the patient. Please refer to After Visit Summary for other counseling recommendations.   Return in about 2 weeks (around 05/05/2022) for LOB, In-Person, any provider.  Future Appointments  Date Time Provider Department Center  05/07/2022  3:15 PM Noralee Chars [facility]  05/21/2022  4:15 PM Corlis Hove, NP [facility]  05/28/2022 11:15 AM Federico Flake, MD [facility]  06/04/2022  3:55 PM Federico Flake, MD [facility]  06/11/2022  4:15 PM Bernerd Limbo, CNM [facility]  06/18/2022  3:15 PM [facility]  06/18/2022  4:15 PM Bernerd Limbo, CNM [facility]    Vonzella Nipple, PA-C

## 2022-04-21 NOTE — Progress Notes (Signed)
Pt reported that she's producing a lot of milk right now & having a lot of swelling only on left side, meaning feet & ankles.

## 2022-04-21 NOTE — Telephone Encounter (Signed)
Called Pt regarding not being able to pick up her Rx for Iron, no answer, left VM stating that her Ins. Would not cover Rx but it's only $4.09 Per [facility] if she would likje to go ahead & get it.

## 2022-04-21 NOTE — Patient Instructions (Addendum)
Childbirth Education Options: °[facility] Classes:  °Childbirth education classes can help you get ready for a positive parenting experience. You can also meet other expectant parents and get free stuff for your baby. Each class runs for five weeks on the same night and costs $45 for the mother-to-be and her support person. Medicaid covers the cost if you are eligible. Call [phone] to register. °[facility] Childbirth Education: °Classes can vary in availability and schedule is subject to change. For most up-to-date information please visit [email or web address] to review and register.  ° ° ° ° ° °AREA PEDIATRIC/FAMILY PRACTICE PHYSICIANS ° °[place] ([address]) °[facility] °Chambliss, MD; Eniola, MD; Hale, MD; Hensel, MD; McDiarmid, MD; McIntyer, MD; Neal, MD; Walden, MD °[address] [place], [place] [address] °[phone] °Mon-Fri 8:30-12:30, 1:30-5:00 °Providers come to see babies at [facility] °Accepting Medicaid °[facility] °Limited providers who accept newborns: Koirala, MD; Morrow, MD; Wolters, MD °[address], [place], [place] [address] °[phone] °Mon-Fri 8:00-5:30 °Babies seen by providers at [facility] °Does NOT accept Medicaid °Please call early in hospitalization for appointment (limited availability)  °[facility] °Mulberry, MD °[address] [place], [place] [address] °[phone] °Mon, Tue, Thur, Fri 8:30-5:00, Wed 10:00-7:00 (closed 1-2pm) °Babies seen by [facility] providers °Accepting Medicaid °[facility] °Rubin, MD °[address], [place], [place] [address] °[phone] °Mon-Fri 8:30-5:00, Sat 8:30-12:00 °Provider comes to see babies at [facility] °Accepting Medicaid °Must have been referred from current patients or contacted office prior to delivery °[facility] ([facility]) °Brown, MD; Chandler, MD; Ettefagh, MD; Grant, MD; Lester, MD; McCormick, MD; McQueen, MD; Prose, MD; Simha, MD; Stanley, MD; Stryffeler, NP; Tebben, NP °[address], [place], [place] [address] °[phone] °Mon, Tue, Thur, Fri 8:30-5:30, Wed 9:30-5:30, Sat 8:30-12:30 °Babies seen by [facility] providers °Accepting Medicaid °Only accepting infants of first-time parents or siblings of current patients °Hospital discharge coordinator will make follow-up appointment °Jack Amos °[address], [place], [place]  [address] °[phone]   Fax - [phone] °[facility] °[address], [place], [place]  [address] °Phone - [phone]   Fax - [phone] °Shilpa Gosrani °[address], [place], [place]  [address] °[phone] ° °[place] ([address]) °[facility] °Bates, MD; Brassfield, MD; Cooper, Cox, MD; MD; Davis, MD; Dovico, MD; Ettefaugh, MD; Little, MD; Lowe, MD; Keiffer, MD; Melvin, MD; Sumner, MD; Williams, MD °[address], [place], [place] [address] °[phone] °Mon-Fri 8:30-5:00 (extended evenings Mon-Thur as needed), Sat-Sun 10:00-1:00 °Providers come to see babies at [facility] °Accepting Medicaid for families of first-time babies and families with all children in the household age 3 and under. Must register with office prior to making appointment (M-F only). °[facility] °Henson, NP; Knapp, MD; Lalonde, MD; Tysinger, PA °[address] [place], [place] [address] °[phone] °Mon-Fri 8:00-5:00 °Babies seen by providers at [facility] °Does NOT accept Medicaid/Commercial Insurance Only °[facility] ([facility]  °Artis, MD; Barnes, MD; Bratton, MD; Coccaro, MD; Lockett Gardner, MD; Kramer, MD; Marshall, MD; Netherton, MD; Poleto, MD; Skinner, MD °[address] [place], [place]  [address] °[phone] °Mon-Fri 8:30-5:30, Sat (Oct.-Mar.) 9:00-1:00 °Babies seen by providers at [facility] °Accepting Medicaid ° °[place] ([address]) °[facility] °Reid, MD; Warner, MD °[address], [place], [place] [address] °[phone] °Mon-Fri 8:30-5:00, Sat 8:30-12:00 °Providers come to see babies at [facility] °Does NOT accept Medicaid °[facility] °Becker, PA; Hagler, MD; Scifres, PA; Sun, MD; Swayne, MD °[address], [place], [place] [address] °[phone] °Mon-Fri 8:00-5:00 °Babies seen by providers at [facility] °Does NOT accept Medicaid °Only accepting babies of parents who are patients °Please call early in hospitalization for appointment (limited availability) °[facility] °Clark, MD; Frye, MD; Kelleher, MD; Mack, NP; Miller, MD; O'Keller, MD; Patterson, NP; Pudlo, MD; Puzio, MD; Thomas, MD; Tucker, MD; Twiselton, MD °[address], [place], [place] [address] °[phone] °Mon-Fri 8:00-5:00, Sat 9:00-12:00 °Providers come to see babies at [facility] °Does NOT accept Medicaid ° °[place] ([address]) °[facility] °Limited providers accepting new patients: Brake, NP; Wharton, PA °[address], [place], [place] [address] °[phone] °Mon-Fri 8:00-5:00 °Babies seen by providers at [facility] °Does NOT accept Medicaid °Only accepting babies of parents who are patients °Please call early in hospitalization for appointment (limited availability) °[facility] °Gay, MD; Quinlan, MD °[address] [place], [place] [address] °[phone] (press 1 to schedule appointment) °Mon-Fri 8:00-5:00 °Providers come to see babies at [facility] °Does NOT accept Medicaid °[facility] °Mazer, MD °[address] [place], [place] [address] °[phone] °Mon-Fri 8:30-5:00 (lunch 12:30-1:00), extended hours by appointment only Wed  5:00-6:30 °Babies seen by [facility] providers °Accepting Medicaid °[facility] °Banks, MD; Jordan, MD; Koberlein, MD °[address], [place], [place] [address] °[phone] °Mon-Fri 8:00-5:00 °Babies seen by [facility] providers °Does NOT accept Medicaid °[facility] °Parker, MD; Hunter, MD; Wallace, DO °[address] [place], [place] [address] °[phone] °Mon-Fri 8:00-5:00 °Babies seen by [facility] providers °Does NOT accept Medicaid °[facility] °Brandon, PA; Brecken, PA; Christy, NP; Dees, MD; DeClaire, MD; DeWeese, MD; Hansen, NP; Mills, NP; Parrish, NP; Smoot, NP; Summer, MD; Vapne, MD °[address] [place], [place] [address] °[phone] °Mon-Fri 8:30-5:00, Sat 10:00-1:00 °Providers come to see babies at [facility] °Does NOT accept Medicaid °Free prenatal information session Tuesdays at 4:45pm °[facility] °Bouska, MD; Gordon, PA; Jeffery, PA; Weber, PA °[address] [place] [address] °[phone] °Mon-Fri 7:30-5:30 °Babies seen by [facility] providers °[facility] °[address], [place], [place]  [address] °[phone]   Fax - [phone] ° °[place] ([address]) °[facility] °Reese, MD °[address] [place], [place] [address] °[phone] °Mon-Thur 8:00-6:00 °Providers come to see babies at [facility] °Accepting Medicaid °[facility] °Anderson, NP; Badger, MD; Beal, PA; Spencer, PA °[address] [place], [place] [address] °[phone] °Mon-Thur 7:30-7:30, Fri 7:30-4:30 °Babies seen by [facility] providers °Accepting Medicaid °[facility] °Agbuya, MD; Klett, NP; Romgoolam, MD °[address], [place], [place] [address] °[phone] °Mon-Fri 8:30-5:00, Sat 8:30-12:00 °Providers come to see babies at [facility] °Accepting Medicaid °Must  have “Meet & Greet” appointment at office prior to delivery °[facility] ([facility]) °McCord, MD; Wallace, MD; Wood, MD °[address], [place], [place] [address] °[phone] °Mon-Wed 8:00-6:00, Thur-Fri 8:00-5:00, Sat 9:00-12:00 °Providers come to see babies at [facility] °Does NOT accept Medicaid °Only accepting siblings of current patients °[facility]  °[address], , [place]  [address] °[phone]   Fax - [phone] °[facility] °[address], [place], [place]  [address] °[phone]   Fax - [phone] ° °[place] ([address]) °[facility] °Cirigliano, DO; Matthews, DO °[address] [place], [place] [address] °[phone] °Mon-Fri 7:00-5:00 °Babies seen by [facility] providers °Does NOT accept Medicaid °[facility] °Briscoe, MD; Howley, PA; Moreira, PA °[address], [place], [place] [address] °[phone] °Mon-Fri 8:00-5:00 °Babies seen by [facility] providers °Accepting Medicaid °[facility] °Boyd, MD; Church, PA; Jones, NP; Osborn, PA °[address], [place], [place] [address] °[phone] °Mon-Fri 8:00-5:00 °Babies seen by providers at [facility] °Accepting Medicaid ° °[place] ([address]) °[facility] [place] °Wendling, DO °[address] [place], [place] [address] °[phone] °Mon-Fri 8:00-5:00 °Babies seen by [facility] providers °Does NOT accept Medicaid °Limited availability, please call early in hospitalization to schedule follow-up °[facility] °Calderon, PA; Cummings, MD; Dillard, MD; Martin, PA; Olson, MD; VanDeven, PA °[address], [place], [place] [address] °[phone] °Mon-Fri 8:30-5:00, Sat 9:00-12:00 °Babies  seen by providers at [facility] °Accepting Medicaid °Please register online then schedule online or call office °[email or web address] °[facility] ([facility]) °Hunter, NP; Kumar, MD; Martin Rogers, PA °[address], [place], [place] [address] °[phone] °Mon-Fri 8:00-5:00 °Babies seen by providers at [facility] °Accepting Medicaid °[facility] ([facility]) °[place]ville, MD; Kristi Fleenor, NP; West, MD °[address], [place], [place] [address] °[phone] °Mon-Fri 8:00-5:30, Sat&Sun by appointment (phones open at 8:30) °Babies seen by [facility] providers °Accepting Medicaid °Must be a first-time baby or sibling of current patient °[facility] [place]  °[address], [place], [place]  [address] °[phone]   Fax - [phone] ° °[place] ([address]) °[facility] °Brown, PA; Cowen, PA; Rice, MD; Helton, PA; Spry, MD °[address] [place], [place] [address] °[phone] °Mon-Thur 8:00-7:00, Fri 8:00-5:00, Sat 8:00-12:00, Sun 9:00-12:00 °Babies seen by [facility] providers °Accepting Medicaid °[facility] °Coe-Goins, MD; Marshall, MD; Pierre-Louis, MD °[address], [place], [place] [address] °[phone] °Mon-Thur 8:00-5:00 °Babies seen by providers at [facility] °Accepting Medicaid °[facility] °Bratton, MD; Coe-Goins, MD; Hayes, MD; Lewis, MD; List, MD; Lott, MD; Marshall, MD; Moran, MD; O'Neal, MD; Pierre-Louis, MD; Pitonzo, MD; Scholer, MD; Spangle, MD °[address] [place], [place] [address] °[phone] °Mon-Fri 8:00-5:30, Sat (Oct.-Mar.) 9:00-1:00 °Babies seen by providers at [facility] °Accepting Medicaid °Must fill out new patient packet, available online at  [email or web address] °[facility] ([facility]) °Friddle, NP; Harris, NP; Kelly, NP; [place], MD; Melvin, PA; Poth, MD; Ramadoss, MD; Stanton, NP °[address], [place], [place] [address] °[phone] °Mon-Thur 8:00-5:30, Fri 8:00-5:00 °Babies seen by providers at [facility] °Accepting Medicaid ° °[place] ([address]) °[facility] °Dixon, PA; Kandiyohi, MD; Pickard, MD; Tapia, PA °[address], [place], [place] [address] °[phone] °Mon-Fri 8:00-5:00 °Babies seen by providers at [facility] °Accepting Medicaid  ° °[place] ([address]) °[facility] [place] °Masneri, DO; Meyers, MD; Nelson, PA °[address], [place], [place] [address] °[phone] °Mon-Fri 8:00-5:00 °Babies seen by providers at [facility] °Does NOT accept   Medicaid °Limited appointment availability, please call early in hospitalization ° °[facility] [place] °Kunedd, DO; McGowen, MD °[address], [place], [place] [address] °[phone] °Mon-Fri 8:00-5:00 °Babies seen by [facility] providers °Does NOT accept Medicaid °[facility] [place] °Cameron, MD; MacDonald, MD; Michaels, PA; Nayak, MD °[address] [place] [address], [place], [place] [address] °[phone] °Mon-Fri 8:00-5:00 °After hours clinic ([address] [place], [place] [address]) [phone] Mon-Fri 5:00-8:00, Sat 12:00-6:00, Sun 10:00-4:00 °Babies seen by [facility] providers °Accepting Medicaid °[facility] [place] °[address], [place], [place]  [address] °[phone]   Fax - [phone] ° °[place] ([address]) °[facility] [place] [facility] °Andy, MD °[address], [place], [place] [address] °[phone] °Mon-Fri 8:00-5:00 °Babies seen by [facility] providers °Does NOT accept Medicaid °[facility] [place] ([facility]  [place]) °Eksir, MD °[address], [place], [place] [address] °[phone] °Mon-Thur 8:00-7:00, Fri 8:00-5:00, Sat 8:00-12:00 °Babies seen by providers at [facility] °Accepting Medicaid - but does not have vaccinations in office (must be received elsewhere) °Limited availability, please call early in hospitalization ° °[place] ([address]) °[facility]  °Charlene Flemming, MD °[address], [place] [address] °[phone]  Fax [phone] ° °[place] °[facility]  °[facility]  °Kimberly Newton, MD, Annamarie Streilein, PA, Carla Hampton, PA °[address] [place] °[place], [place] [address] °[phone] °[facility]  °[address], [place], [place] [address] °[phone] °[address], [place], [place] [address] °[phone] (West Office)  °[facility] °[address], [place], [place] [address] °[phone] °[facility] °[address], [place], [place] [address] °[phone] °[facility] °[address], [place], [place] [address] °[phone] °[facility] °[address], [place], [place] [address] °[phone] °[facility] °[address], [place], [place] [address] °[phone] °[facility] °[address], [place], [place] [address] °[phone] °[facility]  °[address], [place], [place] [address] °[phone] °Dr. Robert W. Little °[address] [place] [address], [place], [place] [address] °[phone] °[facility] °[address], [place], [place] [address] °[phone] °[facility] °[address], [place],  [address] °[phone] ° °

## 2022-04-28 ENCOUNTER — Encounter: Payer: Medicaid Other | Admitting: Obstetrics and Gynecology

## 2022-05-07 ENCOUNTER — Other Ambulatory Visit: Payer: Self-pay

## 2022-05-07 ENCOUNTER — Encounter: Payer: Self-pay | Admitting: Family Medicine

## 2022-05-07 ENCOUNTER — Ambulatory Visit (INDEPENDENT_AMBULATORY_CARE_PROVIDER_SITE_OTHER): Payer: Medicaid Other | Admitting: Family Medicine

## 2022-05-07 VITALS — BP 110/72 | HR 102 | Wt 163.0 lb

## 2022-05-07 DIAGNOSIS — R8271 Bacteriuria: Secondary | ICD-10-CM

## 2022-05-07 DIAGNOSIS — Z3480 Encounter for supervision of other normal pregnancy, unspecified trimester: Secondary | ICD-10-CM

## 2022-05-07 DIAGNOSIS — Z3A34 34 weeks gestation of pregnancy: Secondary | ICD-10-CM

## 2022-05-07 DIAGNOSIS — O99012 Anemia complicating pregnancy, second trimester: Secondary | ICD-10-CM

## 2022-05-07 NOTE — Progress Notes (Signed)
Patient report lower abdominal pain along with "tightening" sensation. Denies any vaginal bleeding or abnormal discharge.

## 2022-05-07 NOTE — Progress Notes (Signed)
   PRENATAL VISIT NOTE  Subjective:  Tricia Welch is a 21 y.o. G1P0 at [redacted]w[redacted]d being seen today for ongoing prenatal care.  She is currently monitored for the following issues for this low-risk pregnancy and has Supervision of other normal pregnancy, antepartum; Intractable migraine without aura and with status migrainosus; Anemia during pregnancy in second trimester; and GBS bacteriuria on their problem list.  Patient reports no complaints.  Contractions: Irritability. Vag. Bleeding: None.  Movement: Present. Denies leaking of fluid.   The following portions of the patient's history were reviewed and updated as appropriate: allergies, current medications, past family history, past medical history, past social history, past surgical history and problem list.   Objective:   Vitals:   05/07/22 1524  BP: 110/72  Pulse: (!) 102  Weight: 163 lb (73.9 kg)    Fetal Status: Fetal Heart Rate (bpm): 152 Fundal Height: 34 cm Movement: Present  Presentation: Vertex  General:  Alert, oriented and cooperative. Patient is in no acute distress.  Skin: Skin is warm and dry. No rash noted.   Cardiovascular: Normal heart rate noted  Respiratory: Normal respiratory effort, no problems with respiration noted  Abdomen: Soft, gravid, appropriate for gestational age.  Pain/Pressure: Present     Pelvic: Cervical exam deferred        Extremities: Normal range of motion.     Mental Status: Normal mood and affect. Normal behavior. Normal judgment and thought content.   Assessment and Plan:  Pregnancy: G1P0 at [redacted]w[redacted]d 1. Supervision of other normal pregnancy, antepartum FHT and FH normal  2. GBS bacteriuria Intrapartum ppx  3. Anemia during pregnancy in second trimester Taking iron  Preterm labor symptoms and general obstetric precautions including but not limited to vaginal bleeding, contractions, leaking of fluid and fetal movement were reviewed in detail with the patient. Please refer to After Visit  Summary for other counseling recommendations.   No follow-ups on file.  Future Appointments  Date Time Provider Department Center  05/21/2022  4:15 PM Corlis Hove, NP [facility]  05/28/2022 11:15 AM Federico Flake, MD [facility]  06/04/2022  3:55 PM Federico Flake, MD [facility]  06/11/2022  4:15 PM Bernerd Limbo, CNM [facility]  06/18/2022  3:15 PM [facility]  06/18/2022  4:15 PM Bernerd Limbo, CNM [facility]    Levie Heritage, DO

## 2022-05-11 ENCOUNTER — Other Ambulatory Visit: Payer: Self-pay

## 2022-05-11 ENCOUNTER — Inpatient Hospital Stay (HOSPITAL_COMMUNITY)
Admission: AD | Admit: 2022-05-11 | Discharge: 2022-05-11 | Disposition: A | Discharge status: Home / Self Care | Payer: Medicaid Other | Attending: Obstetrics and Gynecology | Admitting: Obstetrics and Gynecology

## 2022-05-11 ENCOUNTER — Encounter (HOSPITAL_COMMUNITY): Payer: Self-pay | Admitting: Obstetrics and Gynecology

## 2022-05-11 DIAGNOSIS — Z3A35 35 weeks gestation of pregnancy: Secondary | ICD-10-CM | POA: Diagnosis not present

## 2022-05-11 DIAGNOSIS — E860 Dehydration: Secondary | ICD-10-CM | POA: Diagnosis not present

## 2022-05-11 DIAGNOSIS — R000 Tachycardia, unspecified: Secondary | ICD-10-CM | POA: Diagnosis not present

## 2022-05-11 DIAGNOSIS — O99283 Endocrine, nutritional and metabolic diseases complicating pregnancy, third trimester: Secondary | ICD-10-CM | POA: Insufficient documentation

## 2022-05-11 DIAGNOSIS — R0789 Other chest pain: Secondary | ICD-10-CM | POA: Insufficient documentation

## 2022-05-11 DIAGNOSIS — O99280 Endocrine, nutritional and metabolic diseases complicating pregnancy, unspecified trimester: Secondary | ICD-10-CM | POA: Diagnosis not present

## 2022-05-11 DIAGNOSIS — O26893 Other specified pregnancy related conditions, third trimester: Secondary | ICD-10-CM | POA: Insufficient documentation

## 2022-05-11 DIAGNOSIS — R42 Dizziness and giddiness: Secondary | ICD-10-CM

## 2022-05-11 LAB — URINALYSIS, ROUTINE W REFLEX MICROSCOPIC
Bacteria, UA: NONE SEEN
Bilirubin Urine: NEGATIVE
Glucose, UA: 50 mg/dL — AB
Hgb urine dipstick: NEGATIVE
Ketones, ur: 80 mg/dL — AB
Nitrite: NEGATIVE
Protein, ur: 100 mg/dL — AB
Specific Gravity, Urine: 1.024 (ref 1.005–1.030)
pH: 5 (ref 5.0–8.0)

## 2022-05-11 LAB — BASIC METABOLIC PANEL
Anion gap: 8 (ref 5–15)
BUN: <5 mg/dL — ABNORMAL LOW (ref 6–20)
CO2: 21 mmol/L — ABNORMAL LOW (ref 22–32)
Calcium: 8.3 mg/dL — ABNORMAL LOW (ref 8.9–10.3)
Chloride: 106 mmol/L (ref 98–111)
Creatinine, Ser: 0.73 mg/dL (ref 0.44–1.00)
GFR, Estimated: >60 mL/min (ref >60)
Glucose, Bld: 140 mg/dL — ABNORMAL HIGH (ref 70–99)
Potassium: 3.5 mmol/L (ref 3.5–5.1)
Sodium: 135 mmol/L (ref 135–145)

## 2022-05-11 LAB — CBC WITH DIFFERENTIAL/PLATELET
Abs Immature Granulocytes: 0.02 K/uL (ref 0.00–0.07)
Basophils Absolute: 0 K/uL (ref 0.0–0.1)
Basophils Relative: 0 %
Eosinophils Absolute: 0 K/uL (ref 0.0–0.5)
Eosinophils Relative: 0 %
HCT: 28.3 % — ABNORMAL LOW (ref 36.0–46.0)
Hemoglobin: 9.3 g/dL — ABNORMAL LOW (ref 12.0–15.0)
Immature Granulocytes: 0 %
Lymphocytes Relative: 32 %
Lymphs Abs: 1.5 K/uL (ref 0.7–4.0)
MCH: 27 pg (ref 26.0–34.0)
MCHC: 32.9 g/dL (ref 30.0–36.0)
MCV: 82.3 fL (ref 80.0–100.0)
Monocytes Absolute: 0.3 K/uL (ref 0.1–1.0)
Monocytes Relative: 7 %
Neutro Abs: 2.7 K/uL (ref 1.7–7.7)
Neutrophils Relative %: 61 %
Platelets: 202 K/uL (ref 150–400)
RBC: 3.44 MIL/uL — ABNORMAL LOW (ref 3.87–5.11)
RDW: 13 % (ref 11.5–15.5)
WBC: 4.5 K/uL (ref 4.0–10.5)
nRBC: 0 % (ref 0.0–0.2)

## 2022-05-11 LAB — COMPREHENSIVE METABOLIC PANEL
ALT: 13 U/L (ref 0–44)
AST: 22 U/L (ref 15–41)
Albumin: 2.8 g/dL — ABNORMAL LOW (ref 3.5–5.0)
Alkaline Phosphatase: 231 U/L — ABNORMAL HIGH (ref 38–126)
Anion gap: 14 (ref 5–15)
BUN: 5 mg/dL — ABNORMAL LOW (ref 6–20)
CO2: 18 mmol/L — ABNORMAL LOW (ref 22–32)
Calcium: 8.9 mg/dL (ref 8.9–10.3)
Chloride: 103 mmol/L (ref 98–111)
Creatinine, Ser: 0.71 mg/dL (ref 0.44–1.00)
GFR, Estimated: >60 mL/min (ref >60)
Glucose, Bld: 142 mg/dL — ABNORMAL HIGH (ref 70–99)
Potassium: 3.2 mmol/L — ABNORMAL LOW (ref 3.5–5.1)
Sodium: 135 mmol/L (ref 135–145)
Total Bilirubin: 0.5 mg/dL (ref 0.3–1.2)
Total Protein: 6.2 g/dL — ABNORMAL LOW (ref 6.5–8.1)

## 2022-05-11 LAB — BRAIN NATRIURETIC PEPTIDE: B Natriuretic Peptide: 33.6 pg/mL (ref 0.0–100.0)

## 2022-05-11 LAB — TROPONIN I (HIGH SENSITIVITY): Troponin I (High Sensitivity): 4 ng/L (ref <18)

## 2022-05-11 MED ORDER — LACTATED RINGERS IV BOLUS
1000.0000 mL | Freq: Once | INTRAVENOUS | Status: AC
  Administered 2022-05-11: 1000 mL via INTRAVENOUS

## 2022-05-11 NOTE — Discharge Instructions (Signed)

## 2022-05-11 NOTE — MAU Provider Note (Cosign Needed)
History     CSN: [id number]  Arrival date and time: 05/11/22 1746   Event Date/Time   First Provider Initiated Contact with Patient 05/11/22 1824      Chief Complaint  Patient presents with   Dizziness   Tachycardia   Tricia Welch, a  21 y.o. G1P0 at [redacted]w[redacted]d presents to MAU with complaints of Light headedness and vision going black that started 30 mins -1 hour ago.  Patient states she was at the mall Standing in one stop and became short of breath, heard ringing in her ears and became lightheaded. She denies chest pain but is experiencing chest tightness. She endorses Chicken nuggest and fries before getting to mall and, endorses 27ml of  water today. Denies being outside in the heat, denies a history of asthma. Endorses positive fetal movement. She has no other complaints.    OB History     Gravida  1   Para      Term      Preterm      AB      Living         SAB      IAB      Ectopic      Multiple      Live Births              History reviewed. No pertinent past medical history.  Past Surgical History:  Procedure Laterality Date   TONSILLECTOMY      Family History  Problem Relation Age of Onset   Diabetes Maternal Grandmother    Diabetes Maternal Grandfather    Diabetes Paternal Grandmother    Diabetes Paternal Grandfather     Social History   Tobacco Use   Smoking status: Never    Passive exposure: Never   Smokeless tobacco: Never  Vaping Use   Vaping Use: Never used  Substance Use Topics   Alcohol use: Never   Drug use: Never    Allergies: No Known Allergies  Medications Prior to Admission  Medication Sig Dispense Refill Last Dose   Blood Pressure Monitoring DEVI 1 each by Does not apply route once a week. 1 each 0    ferrous sulfate (FERROUSUL) 325 (65 FE) MG tablet Take 1 tablet (325 mg total) by mouth every other day. (Patient not taking: Reported on 04/21/2022) 30 tablet 3    Prenatal Vit-Fe Fumarate-FA (MULTIVITAMIN-PRENATAL)  27-0.8 MG TABS tablet Take 1 tablet by mouth daily at 12 noon.      promethazine (PHENERGAN) 25 MG tablet Take 1 tablet (25 mg total) by mouth every 6 (six) hours as needed for nausea or vomiting. (Patient not taking: Reported on 04/21/2022) 30 tablet 0     Review of Systems  Constitutional:  Negative for chills and fatigue.  Respiratory:  Positive for chest tightness and shortness of breath. Negative for cough and wheezing.   Cardiovascular:  Negative for chest pain.  Gastrointestinal: Negative.   Genitourinary: Negative.   Musculoskeletal:  Negative for back pain, joint swelling and myalgias.  Skin:  Negative for color change.  Allergic/Immunologic: Negative for environmental allergies, food allergies and immunocompromised state.  Neurological:  Positive for dizziness and light-headedness. Negative for syncope, weakness, numbness and headaches.  Psychiatric/Behavioral:  Negative for confusion. The patient is not nervous/anxious.    Physical Exam   Blood pressure 105/61, pulse (!) 132, temperature 98.5 F (36.9 C), temperature source Oral, resp. rate 17, height 5\' 8"  (1.727 m), weight 74.3 kg, last menstrual period 09/08/2021,  SpO2 96 %. Afebrile  Physical Exam Vitals and nursing note reviewed.  Constitutional:      Appearance: She is ill-appearing.  HENT:     Head: Normocephalic.  Cardiovascular:     Rate and Rhythm: Regular rhythm. Tachycardia present.     Pulses: Normal pulses.     Heart sounds: Normal heart sounds.  Pulmonary:     Effort: Pulmonary effort is normal. Tachypnea present. No respiratory distress.     Breath sounds: Normal breath sounds.  Abdominal:     Palpations: Abdomen is soft.  Musculoskeletal:        General: No swelling. Normal range of motion.     Cervical back: Normal range of motion.  Skin:    General: Skin is warm and dry.     Coloration: Skin is pale.  Neurological:     Mental Status: She is alert and oriented to person, place, and time.   Psychiatric:        Mood and Affect: Mood normal.     FHT: 170 moderate variability with 15x15 accels, no decels noted. No contractions  MAU Course  Procedures Lab Orders         Culture, OB Urine         Urinalysis, Routine w reflex microscopic Urine, Clean Catch         CBC with Differential/Platelet         Comprehensive metabolic panel         Basic metabolic panel    EKG ordered,  Meds ordered this encounter  Medications   lactated ringers bolus 1,000 mL   lactated ringers bolus 1,000 mL   Results for orders placed or performed during the hospital encounter of 05/11/22 (from the past 24 hour(s))  Urinalysis, Routine w reflex microscopic Urine, Clean Catch     Status: Abnormal   Collection Time: 05/11/22  6:07 PM  Result Value Ref Range   Color, Urine AMBER (A) YELLOW   APPearance CLOUDY (A) CLEAR   Specific Gravity, Urine 1.024 1.005 - 1.030   pH 5.0 5.0 - 8.0   Glucose, UA 50 (A) NEGATIVE mg/dL   Hgb urine dipstick NEGATIVE NEGATIVE   Bilirubin Urine NEGATIVE NEGATIVE   Ketones, ur 80 (A) NEGATIVE mg/dL   Protein, ur 962 (A) NEGATIVE mg/dL   Nitrite NEGATIVE NEGATIVE   Leukocytes,Ua TRACE (A) NEGATIVE   RBC / HPF 0-5 0 - 5 RBC/hpf   WBC, UA 11-20 0 - 5 WBC/hpf   Bacteria, UA NONE SEEN NONE SEEN   Squamous Epithelial / LPF 6-10 0 - 5   Mucus PRESENT    Hyaline Casts, UA PRESENT   CBC with Differential/Platelet     Status: Abnormal   Collection Time: 05/11/22  6:53 PM  Result Value Ref Range   WBC 4.5 4.0 - 10.5 K/uL   RBC 3.44 (L) 3.87 - 5.11 MIL/uL   Hemoglobin 9.3 (L) 12.0 - 15.0 g/dL   HCT 22.9 (L) 79.8 - 92.1 %   MCV 82.3 80.0 - 100.0 fL   MCH 27.0 26.0 - 34.0 pg   MCHC 32.9 30.0 - 36.0 g/dL   RDW 19.4 17.4 - 08.1 %   Platelets 202 150 - 400 K/uL   nRBC 0.0 0.0 - 0.2 %   Neutrophils Relative % 61 %   Neutro Abs 2.7 1.7 - 7.7 K/uL   Lymphocytes Relative 32 %   Lymphs Abs 1.5 0.7 - 4.0 K/uL   Monocytes Relative 7 %  Monocytes Absolute 0.3 0.1 - 1.0  K/uL   Eosinophils Relative 0 %   Eosinophils Absolute 0.0 0.0 - 0.5 K/uL   Basophils Relative 0 %   Basophils Absolute 0.0 0.0 - 0.1 K/uL   Immature Granulocytes 0 %   Abs Immature Granulocytes 0.02 0.00 - 0.07 K/uL  Comprehensive metabolic panel     Status: Abnormal   Collection Time: 05/11/22  6:53 PM  Result Value Ref Range   Sodium 135 135 - 145 mmol/L   Potassium 3.2 (L) 3.5 - 5.1 mmol/L   Chloride 103 98 - 111 mmol/L   CO2 18 (L) 22 - 32 mmol/L   Glucose, Bld 142 (H) 70 - 99 mg/dL   BUN 5 (L) 6 - 20 mg/dL   Creatinine, Ser 1.60 0.44 - 1.00 mg/dL   Calcium 8.9 8.9 - 73.7 mg/dL   Total Protein 6.2 (L) 6.5 - 8.1 g/dL   Albumin 2.8 (L) 3.5 - 5.0 g/dL   AST 22 15 - 41 U/L   ALT 13 0 - 44 U/L   Alkaline Phosphatase 231 (H) 38 - 126 U/L   Total Bilirubin 0.5 0.3 - 1.2 mg/dL   GFR, Estimated >10 >62 mL/min   Anion gap 14 5 - 15  Troponin I (High Sensitivity)     Status: None   Collection Time: 05/11/22  7:58 PM  Result Value Ref Range   Troponin I (High Sensitivity) 4 <18 ng/L  Basic metabolic panel     Status: Abnormal   Collection Time: 05/11/22  7:58 PM  Result Value Ref Range   Sodium 135 135 - 145 mmol/L   Potassium 3.5 3.5 - 5.1 mmol/L   Chloride 106 98 - 111 mmol/L   CO2 21 (L) 22 - 32 mmol/L   Glucose, Bld 140 (H) 70 - 99 mg/dL   BUN <5 (L) 6 - 20 mg/dL   Creatinine, Ser 6.94 0.44 - 1.00 mg/dL   Calcium 8.3 (L) 8.9 - 10.3 mg/dL   GFR, Estimated >85 >46 mL/min   Anion gap 8 5 - 15     MDM -FHT: Cat I  -Large amounts of Ketones present, 2L of LR given.  -Urine sent for culture. Results pending.  -EKG revealed Sinus Tachycardia and Possible ST &T  was abnormality. -On call Cardio consulted. Per MD, non-concerning. This may be patients baseline. BMP and Troponin ordered, re-consult with results.    Transfer of care to C. Lloyd Huger, CNM @ 2120.  Nachman Sundt Danella Deis) Suzie Portela, MSN, CNM  Center for [facility]  05/11/22 9:20 PM   Assessment and Plan

## 2022-05-11 NOTE — MAU Note (Signed)
Pt reports to mau with c/o feeling dizzy and seeing black spots for the past 30 min.  Pt denies ctx or LOF, +FM  Last ate chicken nuggets about 1 hour ago.  Pt reports feeling reminds her of when she used to get migraines however denies any headache today.  States she feels like her heart is racing.    BP 92/55 HR 137 SPO2 97 Temp 98.5 Resp 17

## 2022-05-13 LAB — CULTURE, OB URINE: Culture: 50000 — AB

## 2022-05-21 ENCOUNTER — Encounter: Payer: Self-pay | Admitting: Family Medicine

## 2022-05-21 ENCOUNTER — Ambulatory Visit (INDEPENDENT_AMBULATORY_CARE_PROVIDER_SITE_OTHER): Payer: Medicaid Other | Admitting: Student

## 2022-05-21 ENCOUNTER — Other Ambulatory Visit: Payer: Self-pay

## 2022-05-21 ENCOUNTER — Other Ambulatory Visit (HOSPITAL_COMMUNITY)
Admission: RE | Admit: 2022-05-21 | Discharge: 2022-05-21 | Disposition: A | Discharge status: Home / Self Care | Payer: Medicaid Other | Source: Ambulatory Visit | Attending: Student | Admitting: Student

## 2022-05-21 VITALS — BP 117/82 | HR 103 | Wt 165.0 lb

## 2022-05-21 DIAGNOSIS — Z3480 Encounter for supervision of other normal pregnancy, unspecified trimester: Secondary | ICD-10-CM | POA: Insufficient documentation

## 2022-05-21 DIAGNOSIS — R8271 Bacteriuria: Secondary | ICD-10-CM

## 2022-05-21 DIAGNOSIS — O99013 Anemia complicating pregnancy, third trimester: Secondary | ICD-10-CM

## 2022-05-21 DIAGNOSIS — Z3A36 36 weeks gestation of pregnancy: Secondary | ICD-10-CM

## 2022-05-21 MED ORDER — FERROUS SULFATE 325 (65 FE) MG PO TABS: 325.0000 mg | ORAL_TABLET | ORAL | 5 refills | Status: DC

## 2022-05-21 NOTE — Progress Notes (Signed)
   PRENATAL VISIT NOTE  Subjective:  Tricia Welch is a 21 y.o. G1P0 at [redacted]w[redacted]d being seen today for ongoing prenatal care.  She is currently monitored for the following issues for this low-risk pregnancy and has Supervision of other normal pregnancy, antepartum; Intractable migraine without aura and with status migrainosus; Anemia during pregnancy in second trimester; and GBS bacteriuria on their problem list.  Patient reports  pelvic pressure .  Contractions: Irritability. Vag. Bleeding: None.  Movement: Present. Denies leaking of fluid.   The following portions of the patient's history were reviewed and updated as appropriate: allergies, current medications, past family history, past medical history, past social history, past surgical history and problem list.   Objective:   Vitals:   05/21/22 1602  BP: 117/82  Pulse: (!) 103  Weight: 165 lb (74.8 kg)    Fetal Status: Fetal Heart Rate (bpm): 166   Movement: Present     General:  Alert, oriented and cooperative. Patient is in no acute distress.  Skin: Skin is warm and dry. No rash noted.   Cardiovascular: Normal heart rate noted  Respiratory: Normal respiratory effort, no problems with respiration noted  Abdomen: Soft, gravid, appropriate for gestational age.  Pain/Pressure: Present     Pelvic: Cervical exam performed in the presence of a chaperone        Extremities: Normal range of motion.     Mental Status: Normal mood and affect. Normal behavior. Normal judgment and thought content.   Assessment and Plan:  Pregnancy: G1P0 at [redacted]w[redacted]d 1. Supervision of other normal pregnancy, antepartum - Doing well, frequent and vigorous fetal movement - Cervicovaginal ancillary only( [place])  2. [redacted] weeks gestation of pregnancy - Plan for weekly visits until delivery  3. GBS bacteriuria - Plan for prophylactic antibiotics in labor  4. Anemia during pregnancy in third trimester - Unable to take prior Ferrous Sulf. Rx. due to finances,  different order placed - ferrous sulfate 325 (65 FE) MG tablet; Take 1 tablet (325 mg total) by mouth every other day.  Dispense: 60 tablet; Refill: 5  Preterm labor symptoms and general obstetric precautions including but not limited to vaginal bleeding, contractions, leaking of fluid and fetal movement were reviewed in detail with the patient. Please refer to After Visit Summary for other counseling recommendations.   No follow-ups on file.  Future Appointments  Date Time Provider Department Center  05/28/2022 11:15 AM Federico Flake, MD [facility]  06/04/2022  3:55 PM Federico Flake, MD [facility]  06/11/2022  4:15 PM Bernerd Limbo, CNM [facility]  06/18/2022  3:15 PM [facility]  06/18/2022  4:15 PM Bernerd Limbo, CNM [facility]    Corlis Hove, NP

## 2022-05-21 NOTE — Progress Notes (Signed)
Patient reports increase vaginal pressure but denies any pain or LOF

## 2022-05-22 ENCOUNTER — Inpatient Hospital Stay (HOSPITAL_COMMUNITY)
Admission: AD | Admit: 2022-05-22 | Discharge: 2022-05-22 | Disposition: A | Discharge status: Home / Self Care | Payer: Medicaid Other | Attending: Obstetrics and Gynecology | Admitting: Obstetrics and Gynecology

## 2022-05-22 DIAGNOSIS — Z3A36 36 weeks gestation of pregnancy: Secondary | ICD-10-CM | POA: Insufficient documentation

## 2022-05-22 DIAGNOSIS — Z3480 Encounter for supervision of other normal pregnancy, unspecified trimester: Secondary | ICD-10-CM

## 2022-05-22 DIAGNOSIS — O98813 Other maternal infectious and parasitic diseases complicating pregnancy, third trimester: Secondary | ICD-10-CM | POA: Insufficient documentation

## 2022-05-22 DIAGNOSIS — O4703 False labor before 37 completed weeks of gestation, third trimester: Secondary | ICD-10-CM | POA: Diagnosis not present

## 2022-05-22 DIAGNOSIS — A749 Chlamydial infection, unspecified: Secondary | ICD-10-CM | POA: Diagnosis not present

## 2022-05-22 DIAGNOSIS — O479 False labor, unspecified: Secondary | ICD-10-CM

## 2022-05-22 LAB — CERVICOVAGINAL ANCILLARY ONLY
Chlamydia: POSITIVE — AB
Comment: NEGATIVE
Comment: NEGATIVE
Comment: NORMAL
Neisseria Gonorrhea: NEGATIVE
Trichomonas: NEGATIVE

## 2022-05-22 MED ORDER — AZITHROMYCIN 250 MG PO TABS
1000.0000 mg | ORAL_TABLET | Freq: Once | ORAL | Status: AC
  Administered 2022-05-22: 1000 mg via ORAL
  Filled 2022-05-22: qty 4

## 2022-05-22 NOTE — MAU Provider Note (Signed)
Chief Complaint:  Vaginal Discharge   Event Date/Time   First Provider Initiated Contact with Patient 05/22/22 2119      HPI: Tricia Welch is a 21 y.o. G1P0 at [redacted]w[redacted]d who presents to maternity admissions reporting vaginal discharge and some irregular cramping today. She saw in her MyChart that her routine swab in the office yesterday was positive for chlamydia.  There are no other symptoms.   She reports good fetal movement.     HPI  Past Medical History: No past medical history on file.  Past obstetric history: OB History  Gravida Para Term Preterm AB Living  1            SAB IAB Ectopic Multiple Live Births               # Outcome Date GA Lbr Len/2nd Weight Sex Delivery Anes PTL Lv  1 Current             Past Surgical History: Past Surgical History:  Procedure Laterality Date   TONSILLECTOMY      Family History: Family History  Problem Relation Age of Onset   Diabetes Maternal Grandmother    Diabetes Maternal Grandfather    Diabetes Paternal Grandmother    Diabetes Paternal Grandfather     Social History: Social History   Tobacco Use   Smoking status: Never    Passive exposure: Never   Smokeless tobacco: Never  Vaping Use   Vaping Use: Never used  Substance Use Topics   Alcohol use: Never   Drug use: Never    Allergies: No Known Allergies  Meds:  Medications Prior to Admission  Medication Sig Dispense Refill Last Dose   Blood Pressure Monitoring DEVI 1 each by Does not apply route once a week. 1 each 0    ferrous sulfate 325 (65 FE) MG tablet Take 1 tablet (325 mg total) by mouth every other day. 60 tablet 5    Prenatal Vit-Fe Fumarate-FA (MULTIVITAMIN-PRENATAL) 27-0.8 MG TABS tablet Take 1 tablet by mouth daily at 12 noon.      promethazine (PHENERGAN) 25 MG tablet Take 1 tablet (25 mg total) by mouth every 6 (six) hours as needed for nausea or vomiting. (Patient not taking: Reported on 04/21/2022) 30 tablet 0     ROS:  Review of Systems   I have  reviewed patient's Past Medical Hx, Surgical Hx, Family Hx, Social Hx, medications and allergies.   Physical Exam  Patient Vitals for the past 24 hrs:  BP Temp Pulse Resp SpO2 Height Weight  05/22/22 2044 110/69 (!) 97.5 F (36.4 C) (!) 111 17 100 % 5\' 8"  (1.727 m) 74.8 kg   Constitutional: Well-developed, well-nourished female in no acute distress.  Cardiovascular: normal rate Respiratory: normal effort GI: Abd soft, non-tender, gravid appropriate for gestational age.  MS: Extremities nontender, no edema, normal ROM Neurologic: Alert and oriented x 4.  GU: Neg CVAT.  PELVIC EXAM: Cervix pink, visually closed, without lesion, scant white creamy discharge, vaginal walls and external genitalia normal Bimanual exam: Cervix 0/long/high, firm, anterior, neg CMT, uterus nontender, nonenlarged, adnexa without tenderness, enlargement, or mass     FHT:  164 by doppler   Labs: No results found for this or any previous visit (from the past 24 hour(s)). A/Positive/-- (04/28 1112)  Imaging:  No results found.  MAU Course/MDM: Orders Placed This Encounter  Procedures   Discharge patient    Meds ordered this encounter  Medications   azithromycin (ZITHROMAX) tablet 1,000 mg  NST not performed, pt triaged by provider and no emergent symptoms are present. Pt concerned about chlamydia dx.  Tx with azithromycin 1000 mg PO given in MAU tonight Expedited partner therapy for one female partner, Pamella Pert written.   TOC in 3 weeks in the office Labor precautions/reasons to return to MAU reviewed, keep scheduled prenatal appts     Assessment: 1. Supervision of other normal pregnancy, antepartum   2. Chlamydia infection affecting pregnancy in third trimester   3. [redacted] weeks gestation of pregnancy   4. Braxton Hicks contractions     Plan: Discharge home Labor precautions and fetal kick counts  Allergies as of 05/22/2022   No Known Allergies      Medication List     TAKE  these medications    Blood Pressure Monitoring Devi 1 each by Does not apply route once a week.   ferrous sulfate 325 (65 FE) MG tablet Take 1 tablet (325 mg total) by mouth every other day.   multivitamin-prenatal 27-0.8 MG Tabs tablet Take 1 tablet by mouth daily at 12 noon.   promethazine 25 MG tablet Commonly known as: PHENERGAN Take 1 tablet (25 mg total) by mouth every 6 (six) hours as needed for nausea or vomiting.        Sharen Counter Certified Nurse-Midwife 05/22/2022 9:38 PM

## 2022-05-22 NOTE — Discharge Instructions (Signed)
Reasons to return to MAU at [facility]:  Since you are preterm, return to MAU if:  1.  Contractions are 10 minutes apart or less and they becoming more uncomfortable or painful over time 2.  You have a large gush of fluid, or a trickle of fluid that will not stop and you have to wear a pad 3.  You have bleeding that is bright red, heavier than spotting--like menstrual bleeding (spotting can be normal in early labor or after a check of your cervix) 4.  You do not feel the baby moving like he/she normally does   Expedited Partner Therapy:  Information Sheet for Patients and Partners               You have been offered expedited partner therapy (EPT). This information sheet contains important information and warnings you need to be aware of, so please read it carefully.   Expedited Partner Therapy (EPT) is the clinical practice of treating the sexual partners of persons who receive chlamydia, gonorrhea, or trichomoniasis diagnoses by providing medications or prescriptions to the patient. Patients then provide partners with these therapies without the health-care provider having examined the partner. In other words, EPT is a convenient, fast and private way for patients to help their sexual partners get treated.   Chlamydia and gonorrhea are bacterial infections you get from having sex with a person who is already infected. Trichomoniasis (or "trich") is a very common sexually transmitted infection (STI) that is caused by infection with a protozoan parasite called Trichomonas vaginalis.  Many people with these infections don't know it because they feel fine, but without treatment these infections can cause serious health problems, such as pelvic inflammatory disease, ectopic pregnancy, infertility and increased risk of HIV.   It is important to get treated as soon as possible to protect your health, to avoid spreading these infections to others, and to prevent yourself  from becoming re-infected. The good news is these infections can be easily cured with proper antibiotic medicine. The best way to take care of your self is to see a doctor or go to your local health department. If you are not able to see a doctor or other medical provider, you should take EPT.    Recommended Medication: EPT for Chlamydia:  Azithromycin (Zithromax) 1 gram orally in a single dose EPT for Gonorrhea:  Cefixime (Suprax) 400 milligrams orally in a single dose PLUS azithromycin (Zithromax) 1 gram orally in a single dose EPT for Trichomoniasis:  Metronidazole (Flagyl) 2 grams orally in a single dose   These medicines are very safe. However, you should not take them if you have ever had an allergic reaction (like a rash) to any of these medicines: azithromycin (Zithromax), erythromycin, clarithromycin (Biaxin), metronidazole (Flagyl), tinidazole (Tindimax). If you are uncertain about whether you have an allergy, call your medical provider or pharmacist before taking this medicine. If you have a serious, long-term illness like kidney, liver or heart disease, colitis or stomach problems, or you are currently taking other prescription medication, talk to your provider before taking this medication.   Women: If you have lower belly pain, pain during sex, vomiting, or a fever, do not take this medicine. Instead, you should see a medical provider to be certain you do not have pelvic inflammatory disease (PID). PID can be serious and lead to infertility, pregnancy problems or chronic pelvic pain.   Pregnant Women: It is very important for you to see a doctor  to get pregnancy services and pre-natal care. These antibiotics for EPT are safe for pregnant women, but you still need to see a medical provider as soon as possible. It is also important to note that Doxycycline is an alternative therapy for chlamydia, but it should not be taken by someone who is pregnant.   Men: If you have pain or swelling in  the testicles or a fever, do not take this medicine and see a medical provider.     Men who have sex with men (MSM): MSM in [place] continue to experience high rates of syphilis and HIV. Many MSM with gonorrhea or chlamydia could also have syphilis and/or HIV and not know it. If you are a man who has sex with other men, it is very important that you see a medical provider and are tested for HIV and syphilis. EPT is not recommended for gonorrhea for MSM.  Recommended treatment for gonorrhea for MSM is Rocephin (shot) AND azithromycin due to decreased cure rate.  Please see your medical provider if this is the case.    Along with this information sheet is a prescription for the medicine. If you receive a prescription it will be in your name and will indicate your date of birth, or it will be in the name of "Expedited Partner Therapy".   In either case, you can have the prescription filled at a pharmacy. You will be responsible for the cost of the medicine, unless you have prescription drug coverage. In that case, you could provide your name so the pharmacy could bill your health plan.   Take the medication as directed. Some people will have a mild, upset stomach, which does not last long. AVOID alcohol 24 hours after taking metronidazole (Flagyl) to reduce the possibility of a disulfiram-like reaction (severe vomiting and abdominal pain).  After taking the medicine, do not have sex for 7 days. Do not share this medicine or give it to anyone else. It is important to tell everyone you have had sex with in the last 60 days that they need to go and get tested for sexually transmitted infections.   Ways to prevent these and other sexually transmitted infections (STIs):    Abstain from sex. This is the only sure way to avoid getting an STI.   Use barrier methods, such as condoms, consistently and correctly.   Limit the number of sexual partners.   Have regular physical exams, including testing for STIs.    For more information about EPT or other issues pertaining to an STI, please contact your medical provider or the  [facility] Department at [phone] or [email or web address]   [facility] Department - main number [phone] Appointment [phone] or [email or web address]

## 2022-05-22 NOTE — Progress Notes (Signed)
Written and verbal d/c instructions given and understanding voiced. Pt understands to wait before leaving after medication. Pt has prescription for partner and understands no intercourse for a week after her partner is treated.

## 2022-05-22 NOTE — MAU Note (Signed)
.  Tricia Welch is a 21 y.o. at [redacted]w[redacted]d here in MAU reporting some vaginal d/c and itching.. Having some cramping discomfort in lower abdomen that is more than usual. Good FM. Denies VB or LOF. Had appt yesterday and had routine swabs done. Saw on Mychart that she is positive for Chlamydia  Onset of complaint: yesterday Pain score: 4 Vitals:   05/22/22 2044  BP: 110/69  Pulse: (!) 111  Resp: 17  Temp: (!) 97.5 F (36.4 C)  SpO2: 100%     FHT:164 Lab orders placed from triage:  none

## 2022-05-27 NOTE — Progress Notes (Unsigned)
   OBSTETRICS PRENATAL VIRTUAL VISIT ENCOUNTER NOTE  Provider location: Center for [facility]   Patient location: Home  I connected with Tricia Welch on 05/28/22 at 11:15 AM EDT by MyChart Video Encounter and verified that I am speaking with the correct person using two identifiers. I discussed the limitations, risks, security and privacy concerns of performing an evaluation and management service virtually and the availability of in person appointments. I also discussed with the patient that there may be a patient responsible charge related to this service. The patient expressed understanding and agreed to proceed. Subjective:  Tricia Welch is a 21 y.o. G1P0 at [redacted]w[redacted]d being seen today for ongoing prenatal care.  She is currently monitored for the following issues for this low-risk pregnancy and has Supervision of other normal pregnancy, antepartum; Intractable migraine without aura and with status migrainosus; Anemia during pregnancy in second trimester; GBS bacteriuria; and Chlamydia infection affecting pregnancy in third trimester on their problem list.  Patient reports no complaints.  Contractions: Not present. Vag. Bleeding: None.  Movement: Present. Denies any leaking of fluid.   The following portions of the patient's history were reviewed and updated as appropriate: allergies, current medications, past family history, past medical history, past social history, past surgical history and problem list.   Objective:  There were no vitals filed for this visit.  Fetal Status:     Movement: Present     General:  Alert, oriented and cooperative. Patient is in no acute distress.  Respiratory: Normal respiratory effort, no problems with respiration noted  Mental Status: Normal mood and affect. Normal behavior. Normal judgment and thought content.  Rest of physical exam deferred due to type of encounter  Imaging: No results found.  Assessment and Plan:  Pregnancy:  G1P0 at [redacted]w[redacted]d 1. Supervision of other normal pregnancy, antepartum Cultures up to date.   2. Anemia during pregnancy in second trimester Last check 7/9 was still anemic.  Not taking PO iron because not covered by insurance and she could not afford. Would be open to IV iron.   3. GBS bacteriuria PCN in labor - no allergy  4. Chlamydia infection affecting pregnancy in third trimester TOC after 8/10  Term labor symptoms and general obstetric precautions including but not limited to vaginal bleeding, contractions, leaking of fluid and fetal movement were reviewed in detail with the patient. I discussed the assessment and treatment plan with the patient. The patient was provided an opportunity to ask questions and all were answered. The patient agreed with the plan and demonstrated an understanding of the instructions. The patient was advised to call back or seek an in-person office evaluation/go to MAU at [facility] for any urgent or concerning symptoms. Please refer to After Visit Summary for other counseling recommendations.   I provided 13 minutes of face-to-face time during this encounter.  No follow-ups on file.  Future Appointments  Date Time Provider Department Center  06/04/2022  3:55 PM Federico Flake, MD [facility]  06/11/2022  4:15 PM Osborne Oman [facility]  06/18/2022  3:15 PM [facility] NST [facility]  06/18/2022  4:15 PM Bernerd Limbo, CNM [facility]    Milas Hock, MD Center for [employer], [facility]

## 2022-05-28 ENCOUNTER — Telehealth: Payer: Self-pay | Admitting: *Deleted

## 2022-05-28 ENCOUNTER — Encounter: Payer: Self-pay | Admitting: Obstetrics and Gynecology

## 2022-05-28 ENCOUNTER — Telehealth (INDEPENDENT_AMBULATORY_CARE_PROVIDER_SITE_OTHER): Payer: Medicaid Other | Admitting: Obstetrics and Gynecology

## 2022-05-28 DIAGNOSIS — Z3A37 37 weeks gestation of pregnancy: Secondary | ICD-10-CM

## 2022-05-28 DIAGNOSIS — Z3480 Encounter for supervision of other normal pregnancy, unspecified trimester: Secondary | ICD-10-CM

## 2022-05-28 DIAGNOSIS — O99012 Anemia complicating pregnancy, second trimester: Secondary | ICD-10-CM

## 2022-05-28 DIAGNOSIS — O99013 Anemia complicating pregnancy, third trimester: Secondary | ICD-10-CM

## 2022-05-28 DIAGNOSIS — O98813 Other maternal infectious and parasitic diseases complicating pregnancy, third trimester: Secondary | ICD-10-CM

## 2022-05-28 DIAGNOSIS — O99820 Streptococcus B carrier state complicating pregnancy: Secondary | ICD-10-CM

## 2022-05-28 DIAGNOSIS — R8271 Bacteriuria: Secondary | ICD-10-CM

## 2022-05-28 DIAGNOSIS — A749 Chlamydial infection, unspecified: Secondary | ICD-10-CM

## 2022-05-28 NOTE — Telephone Encounter (Signed)
Opened in error

## 2022-06-04 ENCOUNTER — Ambulatory Visit (INDEPENDENT_AMBULATORY_CARE_PROVIDER_SITE_OTHER): Payer: Medicaid Other | Admitting: Family Medicine

## 2022-06-04 ENCOUNTER — Other Ambulatory Visit: Payer: Self-pay

## 2022-06-04 VITALS — BP 115/78 | HR 120 | Wt 162.0 lb

## 2022-06-04 DIAGNOSIS — O98813 Other maternal infectious and parasitic diseases complicating pregnancy, third trimester: Secondary | ICD-10-CM

## 2022-06-04 DIAGNOSIS — Z3A38 38 weeks gestation of pregnancy: Secondary | ICD-10-CM

## 2022-06-04 DIAGNOSIS — R8271 Bacteriuria: Secondary | ICD-10-CM

## 2022-06-04 DIAGNOSIS — Z3480 Encounter for supervision of other normal pregnancy, unspecified trimester: Secondary | ICD-10-CM

## 2022-06-04 DIAGNOSIS — O99013 Anemia complicating pregnancy, third trimester: Secondary | ICD-10-CM

## 2022-06-04 DIAGNOSIS — A749 Chlamydial infection, unspecified: Secondary | ICD-10-CM

## 2022-06-04 NOTE — Progress Notes (Unsigned)
PRe

## 2022-06-04 NOTE — Progress Notes (Signed)
   PRENATAL VISIT NOTE  Subjective:  Tricia Welch is a 21 y.o. G1P0 at [redacted]w[redacted]d being seen today for ongoing prenatal care.  She is currently monitored for the following issues for this low-risk pregnancy and has Supervision of other normal pregnancy, antepartum; Intractable migraine without aura and with status migrainosus; Anemia during pregnancy in second trimester; GBS bacteriuria; and Chlamydia infection affecting pregnancy in third trimester on their problem list.  Patient reports no complaints.  Contractions: Irregular. Vag. Bleeding: None.  Movement: Present. Denies leaking of fluid.   The following portions of the patient's history were reviewed and updated as appropriate: allergies, current medications, past family history, past medical history, past social history, past surgical history and problem list.   Objective:   Vitals:   06/04/22 1603  BP: 115/78  Pulse: (!) 120  Weight: 162 lb (73.5 kg)    Fetal Status: Fetal Heart Rate (bpm): 162   Movement: Present     General:  Alert, oriented and cooperative. Patient is in no acute distress.  Skin: Skin is warm and dry. No rash noted.   Cardiovascular: Normal heart rate noted  Respiratory: Normal respiratory effort, no problems with respiration noted  Abdomen: Soft, gravid, appropriate for gestational age.  Pain/Pressure: Present     Pelvic: Cervical exam deferred        Extremities: Normal range of motion.  Edema: None  Mental Status: Normal mood and affect. Normal behavior. Normal judgment and thought content.   Assessment and Plan:  Pregnancy: G1P0 at [redacted]w[redacted]d 1. Supervision of other normal pregnancy, antepartum Vigorous movement No concerns today Reviewed labor s/sx Discussed possible planning of IOL at 41 weeks-- discussed this would be 8/20 or later.   2. Chlamydia infection affecting pregnancy in third trimester Needs TOC after 8/10  3. GBS bacteriuria PCN in labor  Term labor symptoms and general obstetric  precautions including but not limited to vaginal bleeding, contractions, leaking of fluid and fetal movement were reviewed in detail with the patient. Please refer to After Visit Summary for other counseling recommendations.   No follow-ups on file.  Future Appointments  Date Time Provider Department Center  06/11/2022  4:15 PM Osborne Oman [facility]  06/18/2022  3:15 PM [facility] NST [facility]  06/18/2022  4:15 PM Osborne Oman [facility]    Federico Flake, MD

## 2022-06-11 ENCOUNTER — Ambulatory Visit (INDEPENDENT_AMBULATORY_CARE_PROVIDER_SITE_OTHER): Payer: Medicaid Other | Admitting: Certified Nurse Midwife

## 2022-06-11 ENCOUNTER — Other Ambulatory Visit: Payer: Self-pay

## 2022-06-11 VITALS — BP 102/82 | HR 85 | Wt 165.9 lb

## 2022-06-11 DIAGNOSIS — Z3493 Encounter for supervision of normal pregnancy, unspecified, third trimester: Secondary | ICD-10-CM

## 2022-06-11 DIAGNOSIS — A749 Chlamydial infection, unspecified: Secondary | ICD-10-CM

## 2022-06-11 DIAGNOSIS — Z3A39 39 weeks gestation of pregnancy: Secondary | ICD-10-CM

## 2022-06-11 DIAGNOSIS — O98813 Other maternal infectious and parasitic diseases complicating pregnancy, third trimester: Secondary | ICD-10-CM

## 2022-06-11 DIAGNOSIS — R8271 Bacteriuria: Secondary | ICD-10-CM

## 2022-06-11 NOTE — Progress Notes (Signed)
   PRENATAL VISIT NOTE  Subjective:  Tricia Welch is a 21 y.o. G1P0 at [redacted]w[redacted]d being seen today for ongoing prenatal care.  She is currently monitored for the following issues for this low-risk pregnancy and has Supervision of other normal pregnancy, antepartum; Intractable migraine without aura and with status migrainosus; Anemia during pregnancy in second trimester; GBS bacteriuria; and Chlamydia infection affecting pregnancy in third trimester on their problem list.  Patient reports no complaints.  Contractions: Not present. Vag. Bleeding: None.  Movement: Present. Denies leaking of fluid.   The following portions of the patient's history were reviewed and updated as appropriate: allergies, current medications, past family history, past medical history, past social history, past surgical history and problem list.   Objective:   Vitals:   06/11/22 1639  BP: 102/82  Pulse: 85  Weight: 165 lb 14.4 oz (75.3 kg)    Fetal Status: Fetal Heart Rate (bpm): 150 Fundal Height: 39 cm Movement: Present  Presentation: Vertex  General:  Alert, oriented and cooperative. Patient is in no acute distress.  Skin: Skin is warm and dry. No rash noted.   Cardiovascular: Normal heart rate noted  Respiratory: Normal respiratory effort, no problems with respiration noted  Abdomen: Soft, gravid, appropriate for gestational age.  Pain/Pressure: Present     Pelvic: Cervical exam deferred        Extremities: Normal range of motion.  Edema: None  Mental Status: Normal mood and affect. Normal behavior. Normal judgment and thought content.   Assessment and Plan:  Pregnancy: G1P0 at [redacted]w[redacted]d 1. Encounter for supervision of low-risk pregnancy in third trimester - Doing well, feeling regular and vigorous fetal movement   2. [redacted] weeks gestation of pregnancy - Routine OB care  - Discussed IOL timing (will schedule for 41wks at next visit), signs of labor and when to present to MAU and ways to encourage labor  - May  desire membrane sweep at next visit  3. GBS bacteriuria - Will treat in labor  4. Chlamydia infection affecting pregnancy in third trimester - Needs TOC at next visit  Term labor symptoms and general obstetric precautions including but not limited to vaginal bleeding, contractions, leaking of fluid and fetal movement were reviewed in detail with the patient. Please refer to After Visit Summary for other counseling recommendations.   Return in about 1 week (around 06/18/2022) for IN-PERSON, LOB w NST/BPP.  Future Appointments  Date Time Provider Department Center  06/18/2022  3:15 PM [facility] NST [facility]  06/18/2022  4:15 PM Bernerd Limbo, CNM [facility]    Bernerd Limbo, CNM

## 2022-06-18 ENCOUNTER — Ambulatory Visit: Payer: Medicaid Other | Admitting: *Deleted

## 2022-06-18 ENCOUNTER — Inpatient Hospital Stay (HOSPITAL_COMMUNITY)
Admission: AD | Admit: 2022-06-18 | Discharge: 2022-06-20 | DRG: 805 | Disposition: A | Discharge status: Home / Self Care | Payer: Medicaid Other | Attending: Obstetrics and Gynecology | Admitting: Obstetrics and Gynecology

## 2022-06-18 ENCOUNTER — Other Ambulatory Visit: Payer: Self-pay

## 2022-06-18 ENCOUNTER — Ambulatory Visit (INDEPENDENT_AMBULATORY_CARE_PROVIDER_SITE_OTHER): Payer: Medicaid Other | Admitting: Certified Nurse Midwife

## 2022-06-18 ENCOUNTER — Ambulatory Visit (INDEPENDENT_AMBULATORY_CARE_PROVIDER_SITE_OTHER): Payer: Medicaid Other

## 2022-06-18 VITALS — BP 115/72 | HR 143 | Temp 99.0°F | Wt 168.5 lb

## 2022-06-18 DIAGNOSIS — O99892 Other specified diseases and conditions complicating childbirth: Secondary | ICD-10-CM | POA: Diagnosis present

## 2022-06-18 DIAGNOSIS — U071 COVID-19: Secondary | ICD-10-CM | POA: Diagnosis present

## 2022-06-18 DIAGNOSIS — O99824 Streptococcus B carrier state complicating childbirth: Secondary | ICD-10-CM | POA: Diagnosis present

## 2022-06-18 DIAGNOSIS — O9852 Other viral diseases complicating childbirth: Secondary | ICD-10-CM | POA: Diagnosis present

## 2022-06-18 DIAGNOSIS — Z3480 Encounter for supervision of other normal pregnancy, unspecified trimester: Secondary | ICD-10-CM

## 2022-06-18 DIAGNOSIS — R8271 Bacteriuria: Secondary | ICD-10-CM | POA: Diagnosis present

## 2022-06-18 DIAGNOSIS — R000 Tachycardia, unspecified: Secondary | ICD-10-CM | POA: Diagnosis present

## 2022-06-18 DIAGNOSIS — O480 Post-term pregnancy: Secondary | ICD-10-CM

## 2022-06-18 DIAGNOSIS — O99012 Anemia complicating pregnancy, second trimester: Secondary | ICD-10-CM | POA: Diagnosis present

## 2022-06-18 DIAGNOSIS — O9902 Anemia complicating childbirth: Secondary | ICD-10-CM | POA: Diagnosis present

## 2022-06-18 DIAGNOSIS — O135 Gestational [pregnancy-induced] hypertension without significant proteinuria, complicating the puerperium: Secondary | ICD-10-CM | POA: Diagnosis not present

## 2022-06-18 DIAGNOSIS — Z3493 Encounter for supervision of normal pregnancy, unspecified, third trimester: Secondary | ICD-10-CM

## 2022-06-18 DIAGNOSIS — Z3A40 40 weeks gestation of pregnancy: Secondary | ICD-10-CM

## 2022-06-18 HISTORY — DX: Anemia, unspecified: D64.9

## 2022-06-18 LAB — COMPREHENSIVE METABOLIC PANEL
ALT: 41 U/L (ref 0–44)
AST: 101 U/L — ABNORMAL HIGH (ref 15–41)
Albumin: 2.9 g/dL — ABNORMAL LOW (ref 3.5–5.0)
Alkaline Phosphatase: 294 U/L — ABNORMAL HIGH (ref 38–126)
Anion gap: 10 (ref 5–15)
BUN: 6 mg/dL (ref 6–20)
CO2: 19 mmol/L — ABNORMAL LOW (ref 22–32)
Calcium: 8.8 mg/dL — ABNORMAL LOW (ref 8.9–10.3)
Chloride: 103 mmol/L (ref 98–111)
Creatinine, Ser: 0.75 mg/dL (ref 0.44–1.00)
GFR, Estimated: >60 mL/min (ref >60)
Glucose, Bld: 106 mg/dL — ABNORMAL HIGH (ref 70–99)
Potassium: 3.4 mmol/L — ABNORMAL LOW (ref 3.5–5.1)
Sodium: 132 mmol/L — ABNORMAL LOW (ref 135–145)
Total Bilirubin: 0.5 mg/dL (ref 0.3–1.2)
Total Protein: 6.1 g/dL — ABNORMAL LOW (ref 6.5–8.1)

## 2022-06-18 LAB — CBC
HCT: 29.8 % — ABNORMAL LOW (ref 36.0–46.0)
Hemoglobin: 9.4 g/dL — ABNORMAL LOW (ref 12.0–15.0)
MCH: 25.4 pg — ABNORMAL LOW (ref 26.0–34.0)
MCHC: 31.5 g/dL (ref 30.0–36.0)
MCV: 80.5 fL (ref 80.0–100.0)
Platelets: 176 10*3/uL (ref 150–400)
RBC: 3.7 MIL/uL — ABNORMAL LOW (ref 3.87–5.11)
RDW: 15.7 % — ABNORMAL HIGH (ref 11.5–15.5)
WBC: 5.8 10*3/uL (ref 4.0–10.5)
nRBC: 0.3 % — ABNORMAL HIGH (ref 0.0–0.2)

## 2022-06-18 LAB — WET PREP, GENITAL
Clue Cells Wet Prep HPF POC: NONE SEEN
Sperm: NONE SEEN
Trich, Wet Prep: NONE SEEN
WBC, Wet Prep HPF POC: >=10 — AB (ref <10)
Yeast Wet Prep HPF POC: NONE SEEN

## 2022-06-18 LAB — URINALYSIS, ROUTINE W REFLEX MICROSCOPIC
Bilirubin Urine: NEGATIVE
Glucose, UA: NEGATIVE mg/dL
Hgb urine dipstick: NEGATIVE
Ketones, ur: NEGATIVE mg/dL
Nitrite: NEGATIVE
Protein, ur: NEGATIVE mg/dL
Specific Gravity, Urine: 1.009 (ref 1.005–1.030)
pH: 6 (ref 5.0–8.0)

## 2022-06-18 LAB — RESP PANEL BY RT-PCR (FLU A&B, COVID) ARPGX2
Influenza A by PCR: NEGATIVE
Influenza B by PCR: NEGATIVE
SARS Coronavirus 2 by RT PCR: POSITIVE — AB

## 2022-06-18 LAB — TYPE AND SCREEN
ABO/RH(D): A POS
Antibody Screen: NEGATIVE

## 2022-06-18 LAB — LACTIC ACID, PLASMA: Lactic Acid, Venous: 1.5 mmol/L (ref 0.5–1.9)

## 2022-06-18 MED ORDER — PENICILLIN G POT IN DEXTROSE 60000 UNIT/ML IV SOLN
3.0000 10*6.[IU] | INTRAVENOUS | Status: DC
  Administered 2022-06-19: 3 10*6.[IU] via INTRAVENOUS
  Filled 2022-06-18: qty 50

## 2022-06-18 MED ORDER — ONDANSETRON HCL 4 MG/2ML IJ SOLN: 4.0000 mg | Freq: Four times a day (QID) | INTRAMUSCULAR | Status: DC | PRN

## 2022-06-18 MED ORDER — OXYCODONE-ACETAMINOPHEN 5-325 MG PO TABS: 2.0000 | ORAL_TABLET | ORAL | Status: DC | PRN

## 2022-06-18 MED ORDER — FLEET ENEMA 7-19 GM/118ML RE ENEM: 1.0000 | ENEMA | RECTAL | Status: DC | PRN

## 2022-06-18 MED ORDER — OXYTOCIN-SODIUM CHLORIDE 30-0.9 UT/500ML-% IV SOLN
2.5000 [IU]/h | INTRAVENOUS | Status: DC
  Filled 2022-06-18: qty 500

## 2022-06-18 MED ORDER — LACTATED RINGERS IV BOLUS
1000.0000 mL | Freq: Once | INTRAVENOUS | Status: AC
  Administered 2022-06-18: 1000 mL via INTRAVENOUS

## 2022-06-18 MED ORDER — ACETAMINOPHEN 325 MG PO TABS: 650.0000 mg | ORAL_TABLET | ORAL | Status: DC | PRN

## 2022-06-18 MED ORDER — ACETAMINOPHEN 500 MG PO TABS
1000.0000 mg | ORAL_TABLET | Freq: Once | ORAL | Status: AC
  Administered 2022-06-18: 1000 mg via ORAL
  Filled 2022-06-18: qty 2

## 2022-06-18 MED ORDER — SOD CITRATE-CITRIC ACID 500-334 MG/5ML PO SOLN: 30.0000 mL | ORAL | Status: DC | PRN

## 2022-06-18 MED ORDER — LACTATED RINGERS IV SOLN: INTRAVENOUS | Status: DC

## 2022-06-18 MED ORDER — SODIUM CHLORIDE 0.9 % IV SOLN
5.0000 10*6.[IU] | Freq: Once | INTRAVENOUS | Status: AC
  Administered 2022-06-18: 5 10*6.[IU] via INTRAVENOUS
  Filled 2022-06-18: qty 5

## 2022-06-18 MED ORDER — MISOPROSTOL 50MCG HALF TABLET
50.0000 ug | ORAL_TABLET | Freq: Once | ORAL | Status: AC
  Administered 2022-06-18: 50 ug via ORAL
  Filled 2022-06-18: qty 1

## 2022-06-18 MED ORDER — MISOPROSTOL 50MCG HALF TABLET: 50.0000 ug | ORAL_TABLET | Freq: Once | ORAL | Status: DC

## 2022-06-18 MED ORDER — LIDOCAINE HCL (PF) 1 % IJ SOLN: 30.0000 mL | INTRAMUSCULAR | Status: DC | PRN

## 2022-06-18 MED ORDER — TERBUTALINE SULFATE 1 MG/ML IJ SOLN: 0.2500 mg | Freq: Once | INTRAMUSCULAR | Status: DC | PRN

## 2022-06-18 MED ORDER — OXYCODONE-ACETAMINOPHEN 5-325 MG PO TABS: 1.0000 | ORAL_TABLET | ORAL | Status: DC | PRN

## 2022-06-18 MED ORDER — OXYTOCIN BOLUS FROM INFUSION
333.0000 mL | Freq: Once | INTRAVENOUS | Status: AC
  Administered 2022-06-19: 333 mL via INTRAVENOUS

## 2022-06-18 MED ORDER — LACTATED RINGERS IV SOLN
500.0000 mL | INTRAVENOUS | Status: DC | PRN
  Administered 2022-06-19: 1000 mL via INTRAVENOUS

## 2022-06-18 NOTE — MAU Provider Note (Signed)
History     [id number]  Arrival date and time: 06/18/22 1732    Chief Complaint  Patient presents with   Tachycardia     HPI Tricia Welch is a 21 y.o. at [redacted]w[redacted]d who presents for fetal monitoring. Was in the office today for ROB & testing. Had normal BPP but had fetal & maternal tachycardia. Was sent to MAU (no rooms on birthing suites).  Reports vomiting once this morning but has not continued to have nausea. Had some post nasal drainage today. Denies fever, sore throat, cough, chest pain, palpitations, abdominal pain, dysuria, vaginal bleeding, vaginal discharge, or LOF. Reports good fetal movement.    OB History     Gravida  1   Para      Term      Preterm      AB      Living         SAB      IAB      Ectopic      Multiple      Live Births              Past Medical History:  Diagnosis Date   Anemia     Past Surgical History:  Procedure Laterality Date   TONSILLECTOMY      Family History  Problem Relation Age of Onset   Diabetes Maternal Grandmother    Diabetes Maternal Grandfather    Diabetes Paternal Grandmother    Diabetes Paternal Grandfather     No Known Allergies  No current facility-administered medications on file prior to encounter.   Current Outpatient Medications on File Prior to Encounter  Medication Sig Dispense Refill   Blood Pressure Monitoring DEVI 1 each by Does not apply route once a week. 1 each 0   ferrous sulfate 325 (65 FE) MG tablet Take 1 tablet (325 mg total) by mouth every other day. (Patient not taking: Reported on 06/18/2022) 60 tablet 5   Prenatal Vit-Fe Fumarate-FA (MULTIVITAMIN-PRENATAL) 27-0.8 MG TABS tablet Take 1 tablet by mouth daily at 12 noon. (Patient not taking: Reported on 06/18/2022)       ROS Pertinent positives and negative per HPI, all others reviewed and negative  Physical Exam   BP 111/79   Pulse (!) 122   Temp 98.8 F (37.1 C) (Oral)   LMP 09/08/2021   SpO2 99%   Patient Vitals for  the past 24 hrs:  BP Temp Temp src Pulse SpO2  06/18/22 2005 -- 98.8 F (37.1 C) Oral -- --  06/18/22 1845 111/79 99.1 F (37.3 C) -- (!) 122 99 %    Physical Exam Vitals and nursing note reviewed.  Constitutional:      General: She is not in acute distress.    Appearance: Normal appearance.  HENT:     Head: Normocephalic and atraumatic.  Eyes:     General: No scleral icterus.    Conjunctiva/sclera: Conjunctivae normal.  Cardiovascular:     Rate and Rhythm: Tachycardia present.     Heart sounds: Normal heart sounds.  Pulmonary:     Effort: Pulmonary effort is normal. No respiratory distress.     Breath sounds: Normal breath sounds. No wheezing.  Abdominal:     Tenderness: There is no right CVA tenderness or left CVA tenderness.  Skin:    General: Skin is warm and dry.  Neurological:     Mental Status: She is alert.       FHT Baseline 165, moderate variability, 15x15 accels,  no decels Toco: irregular Cat: 2  Labs Results for orders placed or performed during the hospital encounter of 06/18/22 (from the past 24 hour(s))  Urinalysis, Routine w reflex microscopic     Status: Abnormal   Collection Time: 06/18/22  7:21 PM  Result Value Ref Range   Color, Urine YELLOW YELLOW   APPearance CLEAR CLEAR   Specific Gravity, Urine 1.009 1.005 - 1.030   pH 6.0 5.0 - 8.0   Glucose, UA NEGATIVE NEGATIVE mg/dL   Hgb urine dipstick NEGATIVE NEGATIVE   Bilirubin Urine NEGATIVE NEGATIVE   Ketones, ur NEGATIVE NEGATIVE mg/dL   Protein, ur NEGATIVE NEGATIVE mg/dL   Nitrite NEGATIVE NEGATIVE   Leukocytes,Ua SMALL (A) NEGATIVE   RBC / HPF 0-5 0 - 5 RBC/hpf   WBC, UA 0-5 0 - 5 WBC/hpf   Bacteria, UA RARE (A) NONE SEEN   Squamous Epithelial / LPF 0-5 0 - 5   Mucus PRESENT   Wet prep, genital     Status: Abnormal   Collection Time: 06/18/22  7:21 PM   Specimen: PATH Cytology Cervicovaginal Ancillary Only  Result Value Ref Range   Yeast Wet Prep HPF POC NONE SEEN NONE SEEN    Trich, Wet Prep NONE SEEN NONE SEEN   Clue Cells Wet Prep HPF POC NONE SEEN NONE SEEN   WBC, Wet Prep HPF POC >=10 (A) <10   Sperm NONE SEEN   CBC     Status: Abnormal   Collection Time: 06/18/22  7:29 PM  Result Value Ref Range   WBC 5.8 4.0 - 10.5 K/uL   RBC 3.70 (L) 3.87 - 5.11 MIL/uL   Hemoglobin 9.4 (L) 12.0 - 15.0 g/dL   HCT 71.6 (L) 96.7 - 89.3 %   MCV 80.5 80.0 - 100.0 fL   MCH 25.4 (L) 26.0 - 34.0 pg   MCHC 31.5 30.0 - 36.0 g/dL   RDW 81.0 (H) 17.5 - 10.2 %   Platelets 176 150 - 400 K/uL   nRBC 0.3 (H) 0.0 - 0.2 %  Comprehensive metabolic panel     Status: Abnormal   Collection Time: 06/18/22  7:29 PM  Result Value Ref Range   Sodium 132 (L) 135 - 145 mmol/L   Potassium 3.4 (L) 3.5 - 5.1 mmol/L   Chloride 103 98 - 111 mmol/L   CO2 19 (L) 22 - 32 mmol/L   Glucose, Bld 106 (H) 70 - 99 mg/dL   BUN 6 6 - 20 mg/dL   Creatinine, Ser 5.85 0.44 - 1.00 mg/dL   Calcium 8.8 (L) 8.9 - 10.3 mg/dL   Total Protein 6.1 (L) 6.5 - 8.1 g/dL   Albumin 2.9 (L) 3.5 - 5.0 g/dL   AST 277 (H) 15 - 41 U/L   ALT 41 0 - 44 U/L   Alkaline Phosphatase 294 (H) 38 - 126 U/L   Total Bilirubin 0.5 0.3 - 1.2 mg/dL   GFR, Estimated >82 >42 mL/min   Anion gap 10 5 - 15  Type and screen     Status: None   Collection Time: 06/18/22  7:29 PM  Result Value Ref Range   ABO/RH(D) A POS    Antibody Screen NEG    Sample Expiration      06/21/2022,2359 Performed at [facility], [address], [place], [place] [address]     Imaging No results found.  MAU Course  Procedures Lab Orders         Resp Panel by RT-PCR (  Flu A&B, Covid) Anterior Nasal Swab         Wet prep, genital         CBC         Comprehensive metabolic panel         RPR         Lactic acid, plasma         Urinalysis, Routine w reflex microscopic     Meds ordered this encounter  Medications   lactated ringers bolus 1,000 mL   acetaminophen (TYLENOL) tablet 1,000 mg   Imaging Orders  No imaging studies ordered today     MDM Patient given IV fluids & monitored. Fetal tachycardia continues. Will admit to birthing suites.  Assessment and Plan   1. Fetal tachycardia before the onset of labor   2. [redacted] weeks gestation of pregnancy    -Admit to birthing suites for induction of labor   Judeth Horn, NP 06/18/22 9:06 PM

## 2022-06-18 NOTE — Progress Notes (Signed)
   PRENATAL VISIT NOTE  Subjective:  Tricia Welch is a 21 y.o. G1P0 at [redacted]w[redacted]d being seen today for ongoing prenatal care.  She is currently monitored for the following issues for this low-risk pregnancy and has Supervision of other normal pregnancy, antepartum; Intractable migraine without aura and with status migrainosus; Anemia during pregnancy in second trimester; GBS bacteriuria; and Chlamydia infection affecting pregnancy in third trimester on their problem list.  Patient reports  an episode of vomiting immediately upon waking this morning. She got up to pee and ended up throwing up. No other symptoms, had chills for several hours after - feels fine now. Denies leaking of fluid .  Contractions: Not present. Vag. Bleeding: None.  Movement: Present. Denies leaking of fluid.   The following portions of the patient's history were reviewed and updated as appropriate: allergies, current medications, past family history, past medical history, past social history, past surgical history and problem list.   Objective:   Vitals:   06/18/22 1606  BP: 115/72  Pulse: (!) 143  Temp: 99 F (37.2 C)  Weight: 168 lb 8 oz (76.4 kg)   Fetal Status: Fetal Heart Rate (bpm): NST   Movement: Present     General:  Alert, oriented and cooperative. Patient is in no acute distress.  Skin: Skin is warm and dry. No rash noted.   Cardiovascular: Normal heart rate noted  Respiratory: Normal respiratory effort, no problems with respiration noted  Abdomen: Soft, gravid, appropriate for gestational age.  Pain/Pressure: Present     Pelvic: Cervical exam deferred        Extremities: Normal range of motion.     Mental Status: Normal mood and affect. Normal behavior. Normal judgment and thought content.   Assessment and Plan:  Pregnancy: G1P0 at [redacted]w[redacted]d 1. Encounter for supervision of low-risk pregnancy in third trimester - Doing well overall, feeling regular and vigorous fetal movement  2. [redacted] weeks gestation of  pregnancy - Routine OB care   3. Post-term pregnancy, 40-42 weeks of gestation - Tachycardia of both mom and baby on NST/BPP. NST reactive but tachy. Discussed presentation and findings with Dr. Crissie Reese who recommended pt be either direct admitted for post-dates IOL or sent to MAU for extended monitoring.  - L&D unable to take patient directly, report called to Judeth Horn, NP. Patient instructed to go to MAU for evaluation.  - Explained that she may stay for IOL if FHR remains high, could also be sent home to wait for IOL if everything is normal. Pt understanding and will head to MAU.  Post-term  labor symptoms and general obstetric precautions including but not limited to vaginal bleeding, contractions, leaking of fluid and fetal movement were reviewed in detail with the patient. Please refer to After Visit Summary for other counseling recommendations.   Return in about 6 weeks (around 07/30/2022) for IN-PERSON, PP.  Bernerd Limbo, CNM

## 2022-06-18 NOTE — H&P (Signed)
OBSTETRIC ADMISSION HISTORY AND PHYSICAL  Tricia Welch is a 21 y.o. female G1P0 with IUP at [redacted]w[redacted]d by LMP presenting for fetal monitoring. She had a normal BPP in the office this morning but was noted to have fetal and maternal tachycardia. Now COVID+. Has had COVID several times. Last time being >6 months ago. Started with a sore throat, nausea, and emesis this morning.  She reports +FMs, No LOF, no VB, no blurry vision, headaches, and RUQ pain.  She plans on breast and bottle feeding. She request depo for birth control. Reports taking all of her medication for chlamydia.  She received her prenatal care at [facility]   Dating: By LMP --->  Estimated Date of Delivery: 06/15/22  Sono:    @[redacted]w[redacted]d , CWD, normal anatomy, cephalic presentation, 660 g, EFW   Prenatal History/Complications:  -GBS Positive -Chlamydia infection in third trimester, no TOC  Past Medical History: Past Medical History:  Diagnosis Date   Anemia     Past Surgical History: Past Surgical History:  Procedure Laterality Date   TONSILLECTOMY      Obstetrical History: OB History     Gravida  1   Para      Term      Preterm      AB      Living         SAB      IAB      Ectopic      Multiple      Live Births              Social History Social History   Socioeconomic History   Marital status: Single    Spouse name: Not on file   Number of children: Not on file   Years of education: Not on file   Highest education level: Some college, no degree  Occupational History   Not on file  Tobacco Use   Smoking status: Never    Passive exposure: Never   Smokeless tobacco: Never  Vaping Use   Vaping Use: Never used  Substance and Sexual Activity   Alcohol use: Never   Drug use: Never   Sexual activity: Not Currently    Birth control/protection: None  Other Topics Concern   Not on file  Social History Narrative   Not on file   Social Determinants of Health   Financial Resource Strain: Low  Risk  (06/18/2022)   Overall Financial Resource Strain (CARDIA)    Difficulty of Paying Living Expenses: Not hard at all  Food Insecurity: No Food Insecurity (06/18/2022)   Hunger Vital Sign    Worried About Running Out of Food in the Last Year: Never true    Ran Out of Food in the Last Year: Never true  Transportation Needs: No Transportation Needs (06/18/2022)   PRAPARE - 06/20/2022 (Medical): No    Lack of Transportation (Non-Medical): No  Physical Activity: Unknown (06/18/2022)   Exercise Vital Sign    Days of Exercise per Week: Patient refused    Minutes of Exercise per Session: Not on file  Stress: No Stress Concern Present (06/18/2022)   06/20/2022 of Occupational Health - Occupational Stress Questionnaire    Feeling of Stress : Not at all  Social Connections: Socially Integrated (06/18/2022)   Social Connection and Isolation Panel [NHANES]    Frequency of Communication with Friends and Family: More than three times a week    Frequency of Social Gatherings with Friends  and Family: More than three times a week    Attends Religious Services: More than 4 times per year    Active Member of Clubs or Organizations: Yes    Attends Engineer, structural: More than 4 times per year    Marital Status: Living with partner    Family History: Family History  Problem Relation Age of Onset   Diabetes Maternal Grandmother    Diabetes Maternal Grandfather    Diabetes Paternal Grandmother    Diabetes Paternal Grandfather     Allergies: No Known Allergies  Medications Prior to Admission  Medication Sig Dispense Refill Last Dose   Blood Pressure Monitoring DEVI 1 each by Does not apply route once a week. 1 each 0 More than a month   ferrous sulfate 325 (65 FE) MG tablet Take 1 tablet (325 mg total) by mouth every other day. (Patient not taking: Reported on 06/18/2022) 60 tablet 5 More than a month   Prenatal Vit-Fe Fumarate-FA  (MULTIVITAMIN-PRENATAL) 27-0.8 MG TABS tablet Take 1 tablet by mouth daily at 12 noon. (Patient not taking: Reported on 06/18/2022)   More than a month     Review of Systems   All systems reviewed and negative except as stated in HPI  Blood pressure 115/67, pulse (!) 112, temperature 98.2 F (36.8 C), temperature source Oral, resp. rate 18, height 5\' 8"  (1.727 m), weight 76.2 kg, last menstrual period 09/08/2021, SpO2 99 %. General appearance: alert and no distress Lungs: normal effort Heart: persistently tachycardic Abdomen: gravid Pelvic: 1.5/60/-3 Extremities: No LE edema Presentation: cephalic Fetal monitoringBaseline: 150 bpm, Variability: Good {> 6 bpm), Accelerations: Reactive, and Decelerations: Absent Uterine activityDate/time of onset: Irritability    Prenatal labs: ABO, Rh: --/--/A POS (08/16 1929) Antibody: NEG (08/16 1929) Rubella: 1.58 (04/28 1112) RPR: Non Reactive (05/30 0902)  HBsAg: Negative (04/28 1112)  HIV: Non Reactive (05/30 0902)  GBS:   GBS bacteruria 04/2022 1 hr Glucola 120 Genetic screening normal Anatomy 05/2022 normal  Prenatal Transfer Tool  Maternal Diabetes: No Genetic Screening: Normal Maternal Ultrasounds/Referrals: Normal Fetal Ultrasounds or other Referrals:  None Maternal Substance Abuse:  No Significant Maternal Medications:  None Significant Maternal Lab Results: Group B Strep positive, +Chlamydia 7/19, Tx 7/20, No TOC. Collected during labor admit. COVD+  Results for orders placed or performed during the hospital encounter of 06/18/22 (from the past 24 hour(s))  Lactic acid, plasma   Collection Time: 06/18/22  7:08 PM  Result Value Ref Range   Lactic Acid, Venous 1.5 0.5 - 1.9 mmol/L  Wet prep, genital   Collection Time: 06/18/22  7:21 PM   Specimen: PATH Cytology Cervicovaginal Ancillary Only  Result Value Ref Range   Yeast Wet Prep HPF POC NONE SEEN NONE SEEN   Trich, Wet Prep NONE SEEN NONE SEEN   Clue Cells Wet Prep HPF POC  NONE SEEN NONE SEEN   WBC, Wet Prep HPF POC >=10 (A) <10   Sperm NONE SEEN   Urinalysis, Routine w reflex microscopic   Collection Time: 06/18/22  7:21 PM  Result Value Ref Range   Color, Urine YELLOW YELLOW   APPearance CLEAR CLEAR   Specific Gravity, Urine 1.009 1.005 - 1.030   pH 6.0 5.0 - 8.0   Glucose, UA NEGATIVE NEGATIVE mg/dL   Hgb urine dipstick NEGATIVE NEGATIVE   Bilirubin Urine NEGATIVE NEGATIVE   Ketones, ur NEGATIVE NEGATIVE mg/dL   Protein, ur NEGATIVE NEGATIVE mg/dL   Nitrite NEGATIVE NEGATIVE   Leukocytes,Ua SMALL (A)  NEGATIVE   RBC / HPF 0-5 0 - 5 RBC/hpf   WBC, UA 0-5 0 - 5 WBC/hpf   Bacteria, UA RARE (A) NONE SEEN   Squamous Epithelial / LPF 0-5 0 - 5   Mucus PRESENT   CBC   Collection Time: 06/18/22  7:29 PM  Result Value Ref Range   WBC 5.8 4.0 - 10.5 K/uL   RBC 3.70 (L) 3.87 - 5.11 MIL/uL   Hemoglobin 9.4 (L) 12.0 - 15.0 g/dL   HCT 44.0 (L) 34.7 - 42.5 %   MCV 80.5 80.0 - 100.0 fL   MCH 25.4 (L) 26.0 - 34.0 pg   MCHC 31.5 30.0 - 36.0 g/dL   RDW 95.6 (H) 38.7 - 56.4 %   Platelets 176 150 - 400 K/uL   nRBC 0.3 (H) 0.0 - 0.2 %  Comprehensive metabolic panel   Collection Time: 06/18/22  7:29 PM  Result Value Ref Range   Sodium 132 (L) 135 - 145 mmol/L   Potassium 3.4 (L) 3.5 - 5.1 mmol/L   Chloride 103 98 - 111 mmol/L   CO2 19 (L) 22 - 32 mmol/L   Glucose, Bld 106 (H) 70 - 99 mg/dL   BUN 6 6 - 20 mg/dL   Creatinine, Ser 3.32 0.44 - 1.00 mg/dL   Calcium 8.8 (L) 8.9 - 10.3 mg/dL   Total Protein 6.1 (L) 6.5 - 8.1 g/dL   Albumin 2.9 (L) 3.5 - 5.0 g/dL   AST 951 (H) 15 - 41 U/L   ALT 41 0 - 44 U/L   Alkaline Phosphatase 294 (H) 38 - 126 U/L   Total Bilirubin 0.5 0.3 - 1.2 mg/dL   GFR, Estimated >88 >41 mL/min   Anion gap 10 5 - 15  Type and screen   Collection Time: 06/18/22  7:29 PM  Result Value Ref Range   ABO/RH(D) A POS    Antibody Screen NEG    Sample Expiration      06/21/2022,2359 Performed at [facility], [address]  [place], [place] [address]   Resp Panel by RT-PCR (Flu A&B, Covid) Anterior Nasal Swab   Collection Time: 06/18/22  7:33 PM   Specimen: Anterior Nasal Swab  Result Value Ref Range   SARS Coronavirus 2 by RT PCR POSITIVE (A) NEGATIVE   Influenza A by PCR NEGATIVE NEGATIVE   Influenza B by PCR NEGATIVE NEGATIVE    Patient Active Problem List   Diagnosis Date Noted   Fetal tachycardia before the onset of labor 06/18/2022   Chlamydia infection affecting pregnancy in third trimester 05/22/2022   GBS bacteriuria 04/05/2022   Anemia during pregnancy in second trimester 03/03/2022   Supervision of other normal pregnancy, antepartum 02/04/2022   Intractable migraine without aura and with status migrainosus 03/08/2020    Assessment/Plan:  Tricia Welch is a 21 y.o. G1P0 at [redacted]w[redacted]d here for IOL for maternal and fetal tachycardia noted in the office today. BPP was normal today. +COVID.  Maternal Tachycardia 2/2 COVID  HR now in low teens. No respiratory symptoms. Denies palpitations.  -Continue to monitor vitals  Chlamydia infection affecting pregnancy -Urine cytology pending  Fetal tachycardia, resolved. HR now 150s  #Labor: Cyto x1, FB placed. Consider need for pitocin and AROM when appropriate #Pain: Maternally supported, planning for epidural #FWB: Cat I #ID: GBS + #MOF: Br/Bo #MOC: Depo #Circ: Yes  Bishop Limbo, Medical Student  06/18/2022, 10:49 PM

## 2022-06-18 NOTE — MAU Note (Addendum)
.  Tricia Welch is a 21 y.o. at [redacted]w[redacted]d here in MAU reporting: sent from office after BPP had maternal and fetal tachycardia.  Good fetal movement felt. Denies any vag bleeding or leaking at thsi time.   Onset of complaint: today Pain score: 0 Vitals:   06/18/22 1845  BP: 111/79  Pulse: (!) 122  Temp: 99.1 F (37.3 C)  SpO2: 99%     FHT:165 Lab orders placed from triage:

## 2022-06-18 NOTE — Progress Notes (Signed)
Pt states she did not start taking iron tablets because her insurance did not cover them.  She also has not taken prenatal vitamins in a "long time" .   Persistent FHR tachycardia noted during NST.  Pt states she threw up this morning which is not common for her. She had chills for several hours after vomiting.

## 2022-06-19 ENCOUNTER — Encounter (HOSPITAL_COMMUNITY): Payer: Self-pay | Admitting: Obstetrics & Gynecology

## 2022-06-19 ENCOUNTER — Inpatient Hospital Stay (HOSPITAL_COMMUNITY): Payer: Medicaid Other | Admitting: Anesthesiology

## 2022-06-19 DIAGNOSIS — O99824 Streptococcus B carrier state complicating childbirth: Secondary | ICD-10-CM

## 2022-06-19 DIAGNOSIS — Z3A40 40 weeks gestation of pregnancy: Secondary | ICD-10-CM

## 2022-06-19 DIAGNOSIS — O9902 Anemia complicating childbirth: Secondary | ICD-10-CM

## 2022-06-19 DIAGNOSIS — O9852 Other viral diseases complicating childbirth: Secondary | ICD-10-CM

## 2022-06-19 DIAGNOSIS — U071 COVID-19: Secondary | ICD-10-CM

## 2022-06-19 LAB — GC/CHLAMYDIA PROBE AMP (~~LOC~~) NOT AT ARMC
Chlamydia: NEGATIVE
Comment: NEGATIVE
Comment: NORMAL
Neisseria Gonorrhea: NEGATIVE

## 2022-06-19 LAB — RPR: RPR Ser Ql: NONREACTIVE

## 2022-06-19 LAB — COMPREHENSIVE METABOLIC PANEL
ALT: 32 U/L (ref 0–44)
AST: 67 U/L — ABNORMAL HIGH (ref 15–41)
Albumin: 2.7 g/dL — ABNORMAL LOW (ref 3.5–5.0)
Alkaline Phosphatase: 265 U/L — ABNORMAL HIGH (ref 38–126)
Anion gap: 8 (ref 5–15)
BUN: 5 mg/dL — ABNORMAL LOW (ref 6–20)
CO2: 21 mmol/L — ABNORMAL LOW (ref 22–32)
Calcium: 9.1 mg/dL (ref 8.9–10.3)
Chloride: 109 mmol/L (ref 98–111)
Creatinine, Ser: 1 mg/dL (ref 0.44–1.00)
GFR, Estimated: >60 mL/min (ref >60)
Glucose, Bld: 102 mg/dL — ABNORMAL HIGH (ref 70–99)
Potassium: 3.4 mmol/L — ABNORMAL LOW (ref 3.5–5.1)
Sodium: 138 mmol/L (ref 135–145)
Total Bilirubin: 0.3 mg/dL (ref 0.3–1.2)
Total Protein: 5.3 g/dL — ABNORMAL LOW (ref 6.5–8.1)

## 2022-06-19 LAB — PROTEIN / CREATININE RATIO, URINE
Creatinine, Urine: 19 mg/dL
Total Protein, Urine: <6 mg/dL

## 2022-06-19 MED ORDER — EPHEDRINE 5 MG/ML INJ: 10.0000 mg | INTRAVENOUS | Status: DC | PRN

## 2022-06-19 MED ORDER — LABETALOL HCL 5 MG/ML IV SOLN
INTRAVENOUS | Status: AC
  Filled 2022-06-19: qty 4

## 2022-06-19 MED ORDER — DIPHENHYDRAMINE HCL 50 MG/ML IJ SOLN: 12.5000 mg | INTRAMUSCULAR | Status: DC | PRN

## 2022-06-19 MED ORDER — LABETALOL HCL 5 MG/ML IV SOLN
20.0000 mg | INTRAVENOUS | Status: DC | PRN
Start: 2022-06-19 — End: 2022-06-21
  Administered 2022-06-19: 20 mg via INTRAVENOUS

## 2022-06-19 MED ORDER — HYDRALAZINE HCL 20 MG/ML IJ SOLN
10.0000 mg | INTRAMUSCULAR | Status: DC | PRN
Start: 2022-06-19 — End: 2022-06-21

## 2022-06-19 MED ORDER — DIPHENHYDRAMINE HCL 25 MG PO CAPS: 25.0000 mg | ORAL_CAPSULE | Freq: Four times a day (QID) | ORAL | Status: DC | PRN

## 2022-06-19 MED ORDER — BENZOCAINE-MENTHOL 20-0.5 % EX AERO
1.0000 | INHALATION_SPRAY | CUTANEOUS | Status: DC | PRN
Start: 2022-06-19 — End: 2022-06-21

## 2022-06-19 MED ORDER — FENTANYL-BUPIVACAINE-NACL 0.5-0.125-0.9 MG/250ML-% EP SOLN: 12.0000 mL/h | EPIDURAL | Status: DC | PRN

## 2022-06-19 MED ORDER — ONDANSETRON HCL 4 MG PO TABS: 4.0000 mg | ORAL_TABLET | ORAL | Status: DC | PRN

## 2022-06-19 MED ORDER — FENTANYL CITRATE (PF) 100 MCG/2ML IJ SOLN
INTRAMUSCULAR | Status: AC
  Filled 2022-06-19: qty 2

## 2022-06-19 MED ORDER — LIDOCAINE HCL (PF) 1 % IJ SOLN
INTRAMUSCULAR | Status: DC | PRN
  Administered 2022-06-19: 8 mL via EPIDURAL

## 2022-06-19 MED ORDER — SENNOSIDES-DOCUSATE SODIUM 8.6-50 MG PO TABS
2.0000 | ORAL_TABLET | Freq: Every day | ORAL | Status: DC
  Administered 2022-06-20: 2 via ORAL
  Filled 2022-06-19: qty 2

## 2022-06-19 MED ORDER — MISOPROSTOL 200 MCG PO TABS
800.0000 ug | ORAL_TABLET | Freq: Once | ORAL | Status: AC
  Administered 2022-06-19: 800 ug via RECTAL

## 2022-06-19 MED ORDER — FUROSEMIDE 20 MG PO TABS
20.0000 mg | ORAL_TABLET | Freq: Every day | ORAL | Status: DC
  Administered 2022-06-19 – 2022-06-20 (×2): 20 mg via ORAL
  Filled 2022-06-19 (×2): qty 1

## 2022-06-19 MED ORDER — LACTATED RINGERS IV SOLN: 500.0000 mL | Freq: Once | INTRAVENOUS | Status: DC

## 2022-06-19 MED ORDER — CEFAZOLIN SODIUM-DEXTROSE 1-4 GM/50ML-% IV SOLN
1.0000 g | Freq: Once | INTRAVENOUS | Status: AC
  Administered 2022-06-19: 1 g via INTRAVENOUS
  Filled 2022-06-19: qty 50

## 2022-06-19 MED ORDER — PRENATAL MULTIVITAMIN CH
1.0000 | ORAL_TABLET | Freq: Every day | ORAL | Status: DC
  Administered 2022-06-19 – 2022-06-20 (×2): 1 via ORAL
  Filled 2022-06-19 (×2): qty 1

## 2022-06-19 MED ORDER — PHENYLEPHRINE 80 MCG/ML (10ML) SYRINGE FOR IV PUSH (FOR BLOOD PRESSURE SUPPORT): 80.0000 ug | PREFILLED_SYRINGE | INTRAVENOUS | Status: DC | PRN

## 2022-06-19 MED ORDER — METHYLERGONOVINE MALEATE 0.2 MG/ML IJ SOLN
INTRAMUSCULAR | Status: AC
  Administered 2022-06-19: 0.2 mg
  Filled 2022-06-19: qty 1

## 2022-06-19 MED ORDER — IBUPROFEN 600 MG PO TABS
600.0000 mg | ORAL_TABLET | Freq: Four times a day (QID) | ORAL | Status: DC
  Administered 2022-06-19 – 2022-06-20 (×3): 600 mg via ORAL
  Filled 2022-06-19 (×3): qty 1

## 2022-06-19 MED ORDER — METHYLERGONOVINE MALEATE 0.2 MG/ML IJ SOLN
0.2000 mg | Freq: Four times a day (QID) | INTRAMUSCULAR | Status: AC
  Administered 2022-06-19: 0.2 mg via INTRAMUSCULAR
  Filled 2022-06-19 (×2): qty 1

## 2022-06-19 MED ORDER — LABETALOL HCL 5 MG/ML IV SOLN
40.0000 mg | INTRAVENOUS | Status: DC | PRN
Start: 2022-06-19 — End: 2022-06-21

## 2022-06-19 MED ORDER — LABETALOL HCL 5 MG/ML IV SOLN
80.0000 mg | INTRAVENOUS | Status: DC | PRN
Start: 2022-06-19 — End: 2022-06-21

## 2022-06-19 MED ORDER — OXYTOCIN-SODIUM CHLORIDE 30-0.9 UT/500ML-% IV SOLN
1.0000 m[IU]/min | INTRAVENOUS | Status: DC
  Administered 2022-06-19: 2 m[IU]/min via INTRAVENOUS

## 2022-06-19 MED ORDER — DIBUCAINE (PERIANAL) 1 % EX OINT: 1.0000 | TOPICAL_OINTMENT | CUTANEOUS | Status: DC | PRN

## 2022-06-19 MED ORDER — MISOPROSTOL 200 MCG PO TABS
ORAL_TABLET | ORAL | Status: AC
  Filled 2022-06-19: qty 4

## 2022-06-19 MED ORDER — TETANUS-DIPHTH-ACELL PERTUSSIS 5-2.5-18.5 LF-MCG/0.5 IM SUSY: 0.5000 mL | PREFILLED_SYRINGE | Freq: Once | INTRAMUSCULAR | Status: DC

## 2022-06-19 MED ORDER — ONDANSETRON HCL 4 MG/2ML IJ SOLN
4.0000 mg | INTRAMUSCULAR | Status: DC | PRN
  Administered 2022-06-20: 4 mg via INTRAVENOUS
  Filled 2022-06-19: qty 2

## 2022-06-19 MED ORDER — MISOPROSTOL 50MCG HALF TABLET: 50.0000 ug | ORAL_TABLET | ORAL | Status: DC

## 2022-06-19 MED ORDER — ACETAMINOPHEN 325 MG PO TABS: 650.0000 mg | ORAL_TABLET | ORAL | Status: DC | PRN

## 2022-06-19 MED ORDER — FENTANYL-BUPIVACAINE-NACL 0.5-0.125-0.9 MG/250ML-% EP SOLN
12.0000 mL/h | EPIDURAL | Status: DC | PRN
  Administered 2022-06-19: 12 mL/h via EPIDURAL

## 2022-06-19 MED ORDER — SIMETHICONE 80 MG PO CHEW: 80.0000 mg | CHEWABLE_TABLET | ORAL | Status: DC | PRN

## 2022-06-19 MED ORDER — WITCH HAZEL-GLYCERIN EX PADS
1.0000 | MEDICATED_PAD | CUTANEOUS | Status: DC | PRN
Start: 2022-06-19 — End: 2022-06-21

## 2022-06-19 MED ORDER — NIFEDIPINE ER OSMOTIC RELEASE 30 MG PO TB24
30.0000 mg | ORAL_TABLET | Freq: Every day | ORAL | Status: DC
  Administered 2022-06-19 – 2022-06-20 (×2): 30 mg via ORAL
  Filled 2022-06-19 (×2): qty 1

## 2022-06-19 MED ORDER — COCONUT OIL OIL: 1.0000 | TOPICAL_OIL | Status: DC | PRN

## 2022-06-19 MED ORDER — FENTANYL-BUPIVACAINE-NACL 0.5-0.125-0.9 MG/250ML-% EP SOLN
EPIDURAL | Status: AC
  Filled 2022-06-19: qty 250

## 2022-06-19 MED ORDER — FENTANYL CITRATE (PF) 100 MCG/2ML IJ SOLN
50.0000 ug | INTRAMUSCULAR | Status: DC | PRN
  Administered 2022-06-19 (×2): 100 ug via INTRAVENOUS
  Filled 2022-06-19: qty 2

## 2022-06-19 MED ORDER — IBUPROFEN 600 MG PO TABS
ORAL_TABLET | ORAL | Status: AC
  Administered 2022-06-19: 600 mg via ORAL
  Filled 2022-06-19: qty 1

## 2022-06-19 MED ORDER — METHYLERGONOVINE MALEATE 0.2 MG/ML IJ SOLN: 0.2000 mg | Freq: Once | INTRAMUSCULAR | Status: DC

## 2022-06-19 MED ORDER — TERBUTALINE SULFATE 1 MG/ML IJ SOLN
0.2500 mg | Freq: Once | INTRAMUSCULAR | Status: DC | PRN
Start: 2022-06-19 — End: 2022-06-19

## 2022-06-19 NOTE — Anesthesia Preprocedure Evaluation (Signed)
Anesthesia Evaluation  Patient identified by MRN, date of birth, ID band Patient awake    Reviewed: Allergy & Precautions, H&P , NPO status , Patient's Chart, lab work & pertinent test results, reviewed documented beta blocker date and time   Airway Mallampati: II  TM Distance: >3 FB Neck ROM: full    Dental no notable dental hx. (+) Teeth Intact, Dental Advisory Given   Pulmonary neg pulmonary ROS,    Pulmonary exam normal breath sounds clear to auscultation       Cardiovascular negative cardio ROS Normal cardiovascular exam Rhythm:regular Rate:Normal     Neuro/Psych  Headaches, negative psych ROS   GI/Hepatic negative GI ROS, Neg liver ROS,   Endo/Other  negative endocrine ROS  Renal/GU negative Renal ROS  negative genitourinary   Musculoskeletal   Abdominal   Peds  Hematology  (+) Blood dyscrasia, anemia ,   Anesthesia Other Findings COVID +  Reproductive/Obstetrics (+) Pregnancy                             Anesthesia Physical Anesthesia Plan  ASA: 3  Anesthesia Plan: Epidural   Post-op Pain Management:    Induction:   PONV Risk Score and Plan: 2 and Treatment may vary due to age or medical condition  Airway Management Planned: Natural Airway  Additional Equipment:   Intra-op Plan:   Post-operative Plan:   Informed Consent: I have reviewed the patients History and Physical, chart, labs and discussed the procedure including the risks, benefits and alternatives for the proposed anesthesia with the patient or authorized representative who has indicated his/her understanding and acceptance.     Dental Advisory Given  Plan Discussed with: Anesthesiologist  Anesthesia Plan Comments: (Labs checked- platelets confirmed with RN in room. Fetal heart tracing, per RN, reported to be stable enough for sitting procedure. Discussed epidural, and patient consents to the procedure:   included risk of possible headache,backache, failed block, allergic reaction, and nerve injury. This patient was asked if she had any questions or concerns before the procedure started.)        Anesthesia Quick Evaluation

## 2022-06-19 NOTE — Discharge Summary (Signed)
   Postpartum Discharge Summary  Date of Service updated-8/18     Patient Name: Tricia Welch DOB: 06/24/2001 MRN: [id number]  Date of admission: 06/18/2022 Delivery date:06/19/2022  Delivering provider: CRESENZO, JOHN V  Date of discharge: 06/20/2022  Admitting diagnosis: Fetal tachycardia before the onset of labor [P03.810] Intrauterine pregnancy: [redacted]w[redacted]d     Secondary diagnosis:  Principal Problem:   Fetal tachycardia before the onset of labor Active Problems:   Supervision of other normal pregnancy, antepartum   Anemia during pregnancy in second trimester   GBS bacteriuria   Vaginal delivery  Additional problems: Postpartum Gestational HTN    Discharge diagnosis: Term Pregnancy Delivered                                              Post partum procedures: none Augmentation: AROM, Pitocin, Cytotec, and IP Foley Complications: None  Hospital course: Induction of Labor With Vaginal Delivery   21 y.o. yo G1P1001 at [redacted]w[redacted]d was admitted to the hospital 06/18/2022 for induction of labor.  Indication for induction:  fetal tachycardia at term .  Patient had an uncomplicated labor course as follows: Membrane Rupture Time/Date: 5:11 AM ,06/19/2022   Delivery Method:Vaginal, Spontaneous  Episiotomy: None  Lacerations:  Cervical;Periurethral  Details of delivery can be found in separate delivery note.  On PPD#1 noted to have elevated blood pressures and was started on procardia and Lasix.   Patient is discharged home 06/20/22.  Newborn Data: Birth date:06/19/2022  Birth time:9:43 AM  Gender:Female  Living status:Living  Apgars:8 ,9  Weight:3250 g   Magnesium Sulfate received: No BMZ received: No Rhophylac:N/A MMR:N/A T-DaP:Given prenatally Flu: N/A Transfusion:No  Physical exam  Vitals:   06/19/22 2121 06/20/22 0051 06/20/22 0525 06/20/22 0854  BP: 102/68 108/77 102/77   Pulse: 84 64 78   Resp: 17 16 17   Temp: 98.9 F (37.2 C) 98.5 F (36.9 C) 97.7 F (36.5 C) (!) 97.5 F  (36.4 C)  TempSrc: Oral Oral Oral   SpO2: 98%  98%   Weight:      Height:       General: alert, cooperative, and no distress Lochia: appropriate Uterine Fundus: firm Incision: N/A DVT Evaluation: No evidence of DVT seen on physical exam. Labs: Lab Results  Component Value Date   WBC 5.1 06/20/2022   HGB 10.0 (L) 06/20/2022   HCT 31.2 (L) 06/20/2022   MCV 79.4 (L) 06/20/2022   PLT 165 06/20/2022      Latest Ref Rng & Units 06/19/2022    1:49 PM  CMP  Glucose 70 - 99 mg/dL 102   BUN 6 - 20 mg/dL 5   Creatinine 0.44 - 1.00 mg/dL 1.00   Sodium 135 - 145 mmol/L 138   Potassium 3.5 - 5.1 mmol/L 3.4   Chloride 98 - 111 mmol/L 109   CO2 22 - 32 mmol/L 21   Calcium 8.9 - 10.3 mg/dL 9.1   Total Protein 6.5 - 8.1 g/dL 5.3   Total Bilirubin 0.3 - 1.2 mg/dL 0.3   Alkaline Phos 38 - 126 U/L 265   AST 15 - 41 U/L 67   ALT 0 - 44 U/L 32    Edinburgh Score:    06/19/2022    6:59 PM  Edinburgh Postnatal Depression Scale Screening Tool  I have been able to laugh and see the funny side of   things. 0  I have looked forward with enjoyment to things. 0  I have blamed myself unnecessarily when things went wrong. 0  I have been anxious or worried for no good reason. 0  I have felt scared or panicky for no good reason. 0  Things have been getting on top of me. 1  I have been so unhappy that I have had difficulty sleeping. 0  I have felt sad or miserable. 0  I have been so unhappy that I have been crying. 0  The thought of harming myself has occurred to me. 0  Edinburgh Postnatal Depression Scale Total 1     After visit meds:  Allergies as of 06/20/2022   No Known Allergies      Medication List     STOP taking these medications    ferrous sulfate 325 (65 FE) MG tablet       TAKE these medications    acetaminophen 325 MG tablet Commonly known as: Tylenol Take 2 tablets (650 mg total) by mouth every 4 (four) hours as needed (for pain scale < 4).   furosemide 20 MG  tablet Commonly known as: LASIX Take 1 tablet (20 mg total) by mouth daily for 3 days. Start taking on: June 21, 2022   ibuprofen 600 MG tablet Commonly known as: ADVIL Take 1 tablet (600 mg total) by mouth every 6 (six) hours.   multivitamin-prenatal 27-0.8 MG Tabs tablet Take 1 tablet by mouth daily at 12 noon.   NIFEdipine 30 MG 24 hr tablet Commonly known as: ADALAT CC Take 1 tablet (30 mg total) by mouth daily. Start taking on: June 21, 2022         Discharge home in stable condition Infant Feeding: Breast Infant Disposition:home with mother Discharge instruction: per After Visit Summary and Postpartum booklet. Activity: Advance as tolerated. Pelvic rest for 6 weeks.  Diet: routine diet Future Appointments: Future Appointments  Date Time Provider [place]  08/01/2022  [place], [place], CNM [facility]   Follow up Visit:  [place] for [facility] at [facility]. Schedule an appointment as soon as possible for a visit in 1 week(s).   Specialty: Obstetrics and Gynecology Why: Please follow up in one week for a BP check Contact information: [place] [address] [phone]                Message sent by Gifford Shave on 06/19/22  Please schedule this patient for a In person postpartum visit in 6 weeks with the following provider: Any provider.  Will also plan for one week BP check Additional Postpartum F/U: None    Low risk pregnancy complicated by:  NA Delivery mode:  Vaginal, Spontaneous  Anticipated Birth Control:  Depo  Janyth Pupa, DO Attending [place], [place] for [employer], [place]

## 2022-06-19 NOTE — Progress Notes (Signed)
Labor Progress Note Tricia Welch is a 21 y.o. G1P0 at [redacted]w[redacted]d presented for IOL 2/2 fetal tachycardia due to COVID infection.   S: Doing well, having some discomfort with contractions.   O:  BP 115/67   Pulse (!) 112   Temp 98.2 F (36.8 C) (Oral)   Resp 18   Ht 5\' 8"  (1.727 m)   Wt 76.2 kg   LMP 09/08/2021   SpO2 99%   BMI 25.54 kg/m  EFM: 150bpm/moderate/+accels, occasional early decel  CVE: Dilation: 4.5 Effacement (%): 70 Station: -3 Presentation: Vertex Exam by:: Autry-Lott, DO   A&P: 21 y.o. G1P0 [redacted]w[redacted]d here for IOL 2/2 fetal tachycardia, resolved. COVID infection.   #Labor: Progressing well. Start pitocin #Pain: IV, planning for epidural #FWB: Cat I, early decels likely due to progression of normal labor. Maternal positioning.  #GBS positive  COVID+ Afebrile. HR 94.   [name], DO 3:54 AM

## 2022-06-19 NOTE — Progress Notes (Signed)
Labor Progress Note Tacara Hadlock is a 21 y.o. G1P0 at [redacted]w[redacted]d presented for IOL 2/2 fetal tachycardia due to COVID infection.   S: Doing well, having some discomfort with contractions.   O:  BP 117/75   Pulse (!) 104   Temp 98.4 F (36.9 C) (Oral)   Resp 17   Ht 5\' 8"  (1.727 m)   Wt 76.2 kg   LMP 09/08/2021   SpO2 100%   BMI 25.54 kg/m  EFM: 145bpm/moderate/+accels, occasional variables  CVE: Dilation: 10 Dilation Complete Date: 06/19/22 Dilation Complete Time: 0711 Effacement (%): 100 Station: Plus 2 Presentation: Vertex Exam by:: [email or web address], RN   A&P: 21 y.o. G1P0 [redacted]w[redacted]d here for IOL 2/2 fetal tachycardia, resolved. COVID infection.   #Labor: Progressing well. S/p FB, cytotec, SROM. Now with forebag AROM. Continue pitocin.  #Pain: IV, planning for epidural #FWB: Cat II, maternal position. Ruptured forebag.   #GBS positive  COVID+ Afebrile. HR 123.   Mitsue Peery Autry-Lott, DO 7:26 AM

## 2022-06-19 NOTE — Progress Notes (Signed)
Epidural line removed, no LDA charted. Tip intact upon removal.

## 2022-06-19 NOTE — Progress Notes (Signed)
Called to patients room regarding continued bleeding and boggy uterus. Clot evacuated from lower uterine segment. EBL with additional 379 ml. 800 mcg Cytotec given rectally. @ addition doses of Methergin ordered q 6 hours

## 2022-06-19 NOTE — Progress Notes (Addendum)
 cytotec given rectally by [employer]

## 2022-06-20 ENCOUNTER — Other Ambulatory Visit (HOSPITAL_COMMUNITY): Payer: Self-pay

## 2022-06-20 LAB — CBC
HCT: 31.2 % — ABNORMAL LOW (ref 36.0–46.0)
Hemoglobin: 10 g/dL — ABNORMAL LOW (ref 12.0–15.0)
MCH: 25.4 pg — ABNORMAL LOW (ref 26.0–34.0)
MCHC: 32.1 g/dL (ref 30.0–36.0)
MCV: 79.4 fL — ABNORMAL LOW (ref 80.0–100.0)
Platelets: 165 10*3/uL (ref 150–400)
RBC: 3.93 MIL/uL (ref 3.87–5.11)
RDW: 15.9 % — ABNORMAL HIGH (ref 11.5–15.5)
WBC: 5.1 10*3/uL (ref 4.0–10.5)
nRBC: 0 % (ref 0.0–0.2)

## 2022-06-20 MED ORDER — IBUPROFEN 600 MG PO TABS: 600.0000 mg | ORAL_TABLET | Freq: Four times a day (QID) | ORAL | 0 refills | Status: DC

## 2022-06-20 MED ORDER — NIFEDIPINE ER 30 MG PO TB24
30.0000 mg | ORAL_TABLET | Freq: Every day | ORAL | 0 refills | Status: DC
  Filled 2022-06-20: qty 30, 30d supply, fill #0

## 2022-06-20 MED ORDER — ACETAMINOPHEN 325 MG PO TABS: 650.0000 mg | ORAL_TABLET | ORAL | Status: DC | PRN

## 2022-06-20 MED ORDER — FUROSEMIDE 20 MG PO TABS
20.0000 mg | ORAL_TABLET | Freq: Every day | ORAL | 0 refills | Status: DC
  Filled 2022-06-20: qty 3, 3d supply, fill #0

## 2022-06-20 NOTE — Anesthesia Postprocedure Evaluation (Signed)
Anesthesia Post Note  Patient: Tricia Welch  Procedure(s) Performed: AN AD HOC LABOR EPIDURAL     Patient location during evaluation: Mother Baby Anesthesia Type: Epidural Level of consciousness: awake and alert Pain management: pain level controlled Vital Signs Assessment: post-procedure vital signs reviewed and stable Respiratory status: spontaneous breathing, nonlabored ventilation and respiratory function stable Cardiovascular status: stable Postop Assessment: no headache, no backache, epidural receding, no apparent nausea or vomiting, patient able to bend at knees, adequate PO intake and able to ambulate Anesthetic complications: no   No notable events documented.  Last Vitals:  Vitals:   06/20/22 0525 06/20/22 0854  BP: 102/77   Pulse: 78   Resp: 17   Temp: 36.5 C (!) 36.4 C  SpO2: 98%     Last Pain:  Vitals:   06/20/22 0854  TempSrc:   PainSc: 0-No pain   Pain Goal:                   Laban Emperor

## 2022-06-20 NOTE — Progress Notes (Signed)
POSTPARTUM PROGRESS NOTE  PPD #1  Subjective:  Tricia Welch is a 21 y.o. G1P1001 s/p NSVD at [redacted]w[redacted]d. Today she notes no acute complaints or concerns. She denies any problems with ambulating, voiding or po intake. Denies nausea or vomiting. She has passing flatus, no BM.  Pain is well controlled.  Lochia minimal Denies fever/chills/chest pain/SOB.  no HA, no blurry vision, no RUQ pain  Objective: Blood pressure 102/77, pulse 78, temperature (!) 97.5 F (36.4 C), resp. rate 17, height 5\' 8"  (1.727 m), weight 76.2 kg, last menstrual period 09/08/2021, SpO2 98 %, unknown if currently breastfeeding.  Physical Exam:  General: alert, cooperative and no distress Chest: no respiratory distress Heart: regular rate and rhythm Abdomen: soft, nontender Uterine Fundus: firm, appropriately tender DVT Evaluation: No calf swelling or tenderness Extremities: no edema Skin: warm, dry  Results for orders placed or performed during the hospital encounter of 06/18/22 (from the past 24 hour(s))  Comprehensive metabolic panel     Status: Abnormal   Collection Time: 06/19/22  1:49 PM  Result Value Ref Range   Sodium 138 135 - 145 mmol/L   Potassium 3.4 (L) 3.5 - 5.1 mmol/L   Chloride 109 98 - 111 mmol/L   CO2 21 (L) 22 - 32 mmol/L   Glucose, Bld 102 (H) 70 - 99 mg/dL   BUN 5 (L) 6 - 20 mg/dL   Creatinine, Ser 06/21/22 0.44 - 1.00 mg/dL   Calcium 9.1 8.9 - 8.00 mg/dL   Total Protein 5.3 (L) 6.5 - 8.1 g/dL   Albumin 2.7 (L) 3.5 - 5.0 g/dL   AST 67 (H) 15 - 41 U/L   ALT 32 0 - 44 U/L   Alkaline Phosphatase 265 (H) 38 - 126 U/L   Total Bilirubin 0.3 0.3 - 1.2 mg/dL   GFR, Estimated 34.9 >17 mL/min   Anion gap 8 5 - 15  Protein / creatinine ratio, urine     Status: None   Collection Time: 06/19/22  6:50 PM  Result Value Ref Range   Creatinine, Urine 19 mg/dL   Total Protein, Urine <6 mg/dL   Protein Creatinine Ratio        0.00 - 0.15 mg/mg[Cre]    Assessment/Plan: Tricia Welch is a 21 y.o. G1P1001 s/p  NSVD at [redacted]w[redacted]d PPD#1 complicated by: 1) GestHTN noted on PPD#1 -started on Procardia and Lasix -asymptomatic, BP appropriate  2) Uterine atony -s/p methergine and cytotec -asymptomatic, CBC stable -lochia appropriate  -meeting postpartum milestones appropriately  Contraception: Depot Feeding: breast Desires circumcision for baby boy- inform consent obtained  Dispo: Continue routine postpartum care as outlined above, may consider early discharge home later today pending PEDS   LOS: 2 days   [redacted]w[redacted]d, DO Faculty Attending, [facility] for [facility] 06/20/2022, 10:28 AM

## 2022-06-25 ENCOUNTER — Encounter: Payer: Self-pay | Admitting: General Practice

## 2022-06-27 ENCOUNTER — Ambulatory Visit (INDEPENDENT_AMBULATORY_CARE_PROVIDER_SITE_OTHER): Payer: Medicaid Other

## 2022-06-27 ENCOUNTER — Other Ambulatory Visit: Payer: Self-pay

## 2022-06-27 VITALS — BP 104/71 | HR 69 | Wt 147.3 lb

## 2022-06-27 DIAGNOSIS — Z0130 Encounter for examination of blood pressure without abnormal findings: Secondary | ICD-10-CM

## 2022-06-27 NOTE — Progress Notes (Signed)
Blood Pressure Check Visit  Tricia Welch is here for blood pressure check following spontaneous vaginal delivery on 06/19/22. Diagnosed with gestational hypertension during admission. Discharged with Lasix 20 mg daily for 3 days; which patient reports she has completed. Currently taking Nifedipine 30 mg daily; last dose taken last night. BP today is 104/71. Reports intermittent moderate headache since discharge, none at this time. Pt has not taken any PRN med for this. Encouraged pt to try 2 extra strength Tylenol for headache. Pt to continue checking BP at home daily and logging in [employer]. Pt will follow up if headache does not resolve with Tylenol.   Pt states she is formula feeding but is leaking milk. Pt has no desire to breastfeed or pump breast milk. Reviewed options for encouraging decrease in milk supply.  Marjo Bicker, RN 06/27/2022  10:31 AM

## 2022-06-27 NOTE — Patient Instructions (Addendum)
We would recommend just hand expressing enough milk that you are comfortable. You can do this in the shower or over a sink. You can also apply cold cabbage leaves directly on your breasts. You will want to leave your nipples exposed. You will change the leaves whenever they become warm and do this as many times as you need until you feel relief. A lot of times just hand expressing tiny amounts and using cabbage leaves will be enough!  You may also try a supplement called Sage. If you like drinking hot teas, Pink Stork No Flow Tea or Earth Mama No More Milk Tea are teas that contain Sage and are made to help decrease milk supply. If things don't seem to be improving, you can take over the counter pseudoephedrine 60 mg. Take daily for 5 days.

## 2022-07-04 ENCOUNTER — Encounter: Payer: Self-pay | Admitting: Certified Nurse Midwife

## 2022-08-01 ENCOUNTER — Ambulatory Visit (INDEPENDENT_AMBULATORY_CARE_PROVIDER_SITE_OTHER): Payer: Medicaid Other | Admitting: Obstetrics and Gynecology

## 2022-08-01 ENCOUNTER — Encounter: Payer: Self-pay | Admitting: Obstetrics and Gynecology

## 2022-08-01 MED ORDER — MEDROXYPROGESTERONE ACETATE 150 MG/ML IM SUSP
150.0000 mg | Freq: Once | INTRAMUSCULAR | Status: AC
  Administered 2022-08-01: 150 mg via INTRAMUSCULAR

## 2022-08-01 NOTE — Progress Notes (Signed)
Duplicate

## 2022-08-01 NOTE — Progress Notes (Signed)
Post Partum Visit Note  Tricia Welch is a 21 y.o. G72P1001 female who presents for a postpartum visit. She is 6 weeks postpartum following a normal spontaneous vaginal delivery.  I have fully reviewed the prenatal and intrapartum course. The delivery was at 40.4 gestational weeks.  Anesthesia: epidural. Postpartum course has been. Baby is doing well. Baby is feeding by bottle - Gerber Gentle . Bleeding no bleeding. Bowel function is normal. Bladder function is normal. Patient is sexually active. Contraception method is condoms. Postpartum depression screening: negative.   The pregnancy intention screening data noted above was reviewed. Potential methods of contraception were discussed. The patient elected to proceed with No data recorded.   Edinburgh Postnatal Depression Scale - 08/01/22 1022       Edinburgh Postnatal Depression Scale:  In the Past 7 Days   I have been able to laugh and see the funny side of things. 0    I have looked forward with enjoyment to things. 0    I have blamed myself unnecessarily when things went wrong. 0    I have been anxious or worried for no good reason. 0    I have felt scared or panicky for no good reason. 0    Things have been getting on top of me. 0    I have been so unhappy that I have had difficulty sleeping. 0    I have felt sad or miserable. 0    I have been so unhappy that I have been crying. 0    The thought of harming myself has occurred to me. 0    Edinburgh Postnatal Depression Scale Total 0             Health Maintenance Due  Topic Date Due   HPV VACCINES (1 - 2-dose series) Never done   INFLUENZA VACCINE  Never done    The following portions of the patient's history were reviewed and updated as appropriate: allergies, current medications, past family history, past medical history, past social history, past surgical history, and problem list.  Review of Systems Pertinent items are noted in HPI.  Objective:  BP 105/68   Pulse  91   Ht 5\' 8"  (1.727 m)   Wt 149 lb (67.6 kg)   Breastfeeding No   BMI 22.66 kg/m    General:  alert, cooperative, and no distress   Breasts:  not indicated  Lungs: Normal effort  Heart:  Regular rate  Abdomen: soft, non-tender; bowel sounds normal; no masses,  no organomegaly   Wound N/a  GU exam:  not indicated       Assessment:    1. Postpartum care and examination 6 wk postpartum exam.   Plan:   Essential components of care per ACOG recommendations:  1.  Mood and well being: Patient with negative depression screening today. Reviewed local resources for support.  - Patient tobacco use? No.   - hx of drug use? No.    2. Infant care and feeding:  -Patient currently breastmilk feeding? No.  -Social determinants of health (SDOH) reviewed in EPIC. No concerns  3. Sexuality, contraception and birth spacing - Patient does not want a pregnancy in the next year.  Desired family size is 1 child.  - Reviewed reproductive life planning. Reviewed contraceptive methods based on pt preferences and effectiveness.  Patient desired Hormonal Injection today.   - Discussed birth spacing of 18 months  4. Sleep and fatigue -Encouraged family/partner/community support of 4 hrs of  uninterrupted sleep to help with mood and fatigue  5. Physical Recovery  - Discussed patients delivery and complications. She describes her labor as good. - Patient had a Vaginal, no problems at delivery. Patient had a  peri-urethral  laceration. Perineal healing reviewed. Patient expressed understanding - Patient has urinary incontinence? No. - Patient is safe to resume physical and sexual activity  6.  Health Maintenance - HM due items addressed Yes - Last pap smear No results found for: "DIAGPAP" Pap smear not done at today's visit. Had HPV vaccine. She is not yet due for pap smears but we discussed would start at age 50.  -Breast Cancer screening indicated? No.   7. Chronic Disease/Pregnancy Condition  follow up: None - PCP follow up  Radene Gunning, [place] for [facility], [place]

## 2022-08-01 NOTE — Addendum Note (Signed)
Addended by: Shelly Coss on: 08/01/2022 10:56 AM   Modules accepted: Orders

## 2022-09-15 ENCOUNTER — Encounter (HOSPITAL_COMMUNITY): Payer: Self-pay

## 2022-09-15 ENCOUNTER — Ambulatory Visit (HOSPITAL_COMMUNITY)
Admission: EM | Admit: 2022-09-15 | Discharge: 2022-09-15 | Disposition: A | Discharge status: Home / Self Care | Payer: Medicaid Other | Attending: Emergency Medicine | Admitting: Emergency Medicine

## 2022-09-15 DIAGNOSIS — K219 Gastro-esophageal reflux disease without esophagitis: Secondary | ICD-10-CM

## 2022-09-15 DIAGNOSIS — M940 Chondrocostal junction syndrome [Tietze]: Secondary | ICD-10-CM | POA: Diagnosis not present

## 2022-09-15 MED ORDER — PANTOPRAZOLE SODIUM 20 MG PO TBEC: 20.0000 mg | DELAYED_RELEASE_TABLET | Freq: Every day | ORAL | 1 refills | Status: DC

## 2022-09-15 NOTE — ED Provider Notes (Signed)
[facility]    CSN: [id number] Arrival date & time: 09/15/22  1738     History   Chief Complaint Chief Complaint  Patient presents with   Emesis    HPI Tricia Welch is a 21 y.o. female.  Presents with symptoms that began today Takes a deep breath and feels pain in her sternum and around the ribs  She woke from sleep last night with discomfort in the epigastrium into the substernal area, caused her to vomit Had similar episode to this 2 nights ago  Reports she eats a lot of greasy/fatty foods Denies caffeine or alcohol use  No fevers or diarrhea No recent URI or cough  Has not tried interventions for her symptoms  Denies possibility of pregnancy.  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 Authorizing Provider  pantoprazole (PROTONIX) 20 MG tablet Take 1 tablet (20 mg total) by mouth daily. 09/15/22  Yes Shrey Boike, Lurena Joiner, PA-C    Family History Family History  Problem Relation Age of Onset   Diabetes Maternal Grandmother    Diabetes Maternal Grandfather    Diabetes Paternal Grandmother    Diabetes Paternal Grandfather     Social History Social History   Tobacco Use   Smoking status: Never    Passive exposure: Never   Smokeless tobacco: Never  Vaping Use   Vaping Use: Never used  Substance Use Topics   Alcohol use: Never   Drug use: Never     Allergies   Patient has no known allergies.   Review of Systems Review of  Systems Per HPI  Physical Exam Triage Vital Signs ED Triage Vitals  Enc Vitals Group     BP 09/15/22 1907 119/76     Pulse Rate 09/15/22 1907 84     Resp 09/15/22 1907 12     Temp 09/15/22 1907 98.4 F (36.9 C)     Temp Source 09/15/22 1907 Oral     SpO2 09/15/22 1907 100 %     Weight --      Height --      Head Circumference --      Peak Flow --      Pain Score 09/15/22 1904 7     Pain Loc --      Pain Edu? --      Excl. in GC? --    No data found.  Updated Vital Signs BP 119/76 (BP Location: Left Arm)   Pulse 84   Temp 98.4 F (36.9 C) (Oral)   Resp 12   SpO2 100%    Physical Exam Vitals and nursing note reviewed.  Constitutional:  General: She is not in acute distress.    Appearance: She is not ill-appearing.  HENT:     Mouth/Throat:     Mouth: Mucous membranes are moist.     Pharynx: Oropharynx is clear.  Cardiovascular:     Rate and Rhythm: Normal rate and regular rhythm.     Pulses: Normal pulses.     Heart sounds: Normal heart sounds.  Pulmonary:     Effort: Pulmonary effort is normal.     Breath sounds: Normal breath sounds.  Chest:     Chest wall: Tenderness present.       Comments: Some pain with palpation of lower ribs, substernal area.  Feels pain with lifting arms and twisting motion  Abdominal:     General: Abdomen is flat.     Palpations: Abdomen is soft.     Tenderness: There is abdominal tenderness in the epigastric area. There is no guarding or rebound.     Comments: Epigastric discomfort   Skin:    General: Skin is warm and dry.  Neurological:     Mental Status: She is alert and oriented to person, place, and time.     UC Treatments / Results  Labs (all labs ordered are listed, but only abnormal results are displayed) Labs Reviewed - No data to display  EKG   Radiology No results found.  Procedures Procedures (including critical care time)  Medications Ordered in UC Medications - No data to display  Initial  Impression / Assessment and Plan / UC Course  I have reviewed the triage vital signs and the nursing notes.  Pertinent labs & imaging results that were available during my care of the patient were reviewed by me and considered in my medical decision making (see chart for details).  No acute distress, well appearing. No shortness of breath.  Likely costochondritis.  Discomfort worse with moving, deep breath, twisting.  Sounds muscular/cartilage  Recommend using Tylenol for the next few days to reduce inflammation, pain control  I additionally think there is a reflux component Discussed diet changes, can also try the Protonix once daily Added to PCP assistance list Work note given Return precautions discussed. Patient agrees to plan  Final Clinical Impressions(s) / UC Diagnoses   Final diagnoses:  Costochondritis  Gastroesophageal reflux disease without esophagitis     Discharge Instructions      You can take extra strength tylenol up to 1,000 mg every 4 hours for the next few days to reduce pain and inflammation.  Take the reflux medication once daily. Try for about two weeks, increase to twice daily then if needed  Monitor symptoms and return with concerns  Someone will reach out to you and help set up with primary care provider    ED Prescriptions     Medication Sig Dispense Auth. Provider   pantoprazole (PROTONIX) 20 MG tablet Take 1 tablet (20 mg total) by mouth daily. 30 tablet Dashanti Burr, Lurena Joiner, PA-C      PDMP not reviewed this encounter.   Amayrani Bennick, Ray Church 09/15/22 2021

## 2022-09-15 NOTE — Discharge Instructions (Addendum)
You can take extra strength tylenol up to 1,000 mg every 4 hours for the next few days to reduce pain and inflammation.  Take the reflux medication once daily. Try for about two weeks, increase to twice daily then if needed  Monitor symptoms and return with concerns  Someone will reach out to you and help set up with primary care provider

## 2022-09-15 NOTE — ED Triage Notes (Signed)
Pt states when she takes a deep breath she feel pain in the chest , vomiting, facial swelling x 2days

## 2022-09-16 ENCOUNTER — Encounter: Payer: Self-pay | Admitting: Emergency Medicine

## 2022-09-23 ENCOUNTER — Encounter (HOSPITAL_COMMUNITY): Payer: Self-pay | Admitting: *Deleted

## 2022-09-23 ENCOUNTER — Ambulatory Visit (HOSPITAL_COMMUNITY)
Admission: EM | Admit: 2022-09-23 | Discharge: 2022-09-23 | Disposition: A | Discharge status: Home / Self Care | Payer: Medicaid Other | Attending: Nurse Practitioner | Admitting: Nurse Practitioner

## 2022-09-23 DIAGNOSIS — R0789 Other chest pain: Secondary | ICD-10-CM

## 2022-09-23 DIAGNOSIS — M940 Chondrocostal junction syndrome [Tietze]: Secondary | ICD-10-CM

## 2022-09-23 LAB — POC URINE PREG, ED: Preg Test, Ur: NEGATIVE

## 2022-09-23 MED ORDER — ALUM & MAG HYDROXIDE-SIMETH 200-200-20 MG/5ML PO SUSP
ORAL | Status: AC
  Filled 2022-09-23: qty 30

## 2022-09-23 MED ORDER — ALUM & MAG HYDROXIDE-SIMETH 200-200-20 MG/5ML PO SUSP
30.0000 mL | Freq: Once | ORAL | Status: AC
Start: 2022-09-23 — End: 2022-09-23
  Administered 2022-09-23: 30 mL via ORAL

## 2022-09-23 NOTE — ED Provider Notes (Signed)
[facility]    CSN: [id number] Arrival date & time: 09/23/22  1840      History   Chief Complaint Chief Complaint  Patient presents with   Chest Pain    HPI Tricia Welch is a 21 y.o. female.   Patient presents for approximately 3 hours of recurrent chest pain that began today and has been persistent for the past approximate week.  Reports the pain comes and goes, is unknown what makes the pain better or worse.  Reports she was seen last week and treated with Protonix which does not seem to help.  Reports the pain is in the lower part of her chest and radiates around her rib cage.  She denies shortness of breath, cough, congestion, sore throat, or headache.  No recent fevers, nausea/vomiting, heartburn, diaphoresis, or palpitations.  Pain does not appear to be related to exertion or after eating.  No dysuria/urinary frequency or urgency.  Reports she has similar symptoms last time she was pregnant.  Last menstrual period unknown.    Past Medical History:  Diagnosis Date   Anemia     Patient Active Problem List   Diagnosis Date Noted   Intractable migraine without aura and with status migrainosus 03/08/2020    Past Surgical History:  Procedure Laterality Date   TONSILLECTOMY      OB History     Gravida  1   Para  1   Term  1   Preterm      AB      Living  1      SAB      IAB      Ectopic      Multiple  0   Live Births  1            Home Medications    Prior to Admission medications   Medication Sig Start Date End Date Taking? Authorizing Provider  pantoprazole (PROTONIX) 20 MG tablet Take 1 tablet (20 mg total) by mouth daily. 09/15/22  Yes Rising, Lurena Joiner, PA-C    Family History Family History  Problem Relation Age of Onset   Diabetes Maternal Grandmother    Diabetes Maternal Grandfather    Diabetes Paternal Grandmother    Diabetes Paternal Grandfather     Social History Social History   Tobacco Use   Smoking status:  Never    Passive exposure: Never   Smokeless tobacco: Never  Vaping Use   Vaping Use: Never used  Substance Use Topics   Alcohol use: Never   Drug use: Never     Allergies   Patient has no known allergies.   Review of Systems Review of Systems Per HPI  Physical Exam Triage Vital Signs ED Triage Vitals  Enc Vitals Group     BP 09/23/22 1850 113/79     Pulse Rate 09/23/22 1850 66     Resp --      Temp 09/23/22 1850 98.4 F (36.9 C)     Temp Source 09/23/22 1850 Oral     SpO2 09/23/22 1850 98 %     Weight --      Height --      Head Circumference --      Peak Flow --      Pain Score 09/23/22 1849 6     Pain Loc --      Pain Edu? --      Excl. in GC? --    No data found.  Updated Vital Signs  BP 113/79 (BP Location: Left Arm)   Pulse 66   Temp 98.4 F (36.9 C) (Oral)   SpO2 98%   Visual Acuity Right Eye Distance:   Left Eye Distance:   Bilateral Distance:    Right Eye Near:   Left Eye Near:    Bilateral Near:     Physical Exam Vitals and nursing note reviewed.  Constitutional:      General: She is not in acute distress.    Appearance: Normal appearance. She is not toxic-appearing.  HENT:     Mouth/Throat:     Mouth: Mucous membranes are moist.     Pharynx: Oropharynx is clear.  Cardiovascular:     Rate and Rhythm: Normal rate and regular rhythm.  Pulmonary:     Effort: Pulmonary effort is normal. No respiratory distress.     Breath sounds: No wheezing, rhonchi or rales.  Chest:     Chest wall: Tenderness present.       Comments: Pain worse with palpation in areas marked and radiates out to each rib cage.  No swelling, erythema, or bruising.  Abdominal:     General: Abdomen is flat. Bowel sounds are normal. There is no distension.     Palpations: Abdomen is soft.     Tenderness: There is no abdominal tenderness. There is no guarding.  Skin:    General: Skin is warm and dry.     Capillary Refill: Capillary refill takes less than 2 seconds.      Coloration: Skin is not jaundiced or pale.     Findings: No erythema.  Neurological:     Mental Status: She is alert and oriented to person, place, and time.  Psychiatric:        Behavior: Behavior is cooperative.      UC Treatments / Results  Labs (all labs ordered are listed, but only abnormal results are displayed) Labs Reviewed  POC URINE PREG, ED    EKG   Radiology No results found.  Procedures Procedures (including critical care time)  Medications Ordered in UC Medications  alum & mag hydroxide-simeth (MAALOX/MYLANTA) 200-200-20 MG/5ML suspension 30 mL (30 mLs Oral Given 09/23/22 1913)    Initial Impression / Assessment and Plan / UC Course  I have reviewed the triage vital signs and the nursing notes.  Pertinent labs & imaging results that were available during my care of the patient were reviewed by me and considered in my medical decision making (see chart for details).   Patient is well-appearing, normotensive, afebrile, not tachycardic, not tachypneic, oxygenating well on room air.    Chest wall pain Costochondritis Flags in history or on examination today EKG is reassuring; shows normal sinus rhythm with no ST segment or T wave elevation Urine pregnancy test today is negative GI cocktail given with improvement in the "tightness" Differentials include GERD versus costochondritis; given tenderness to palpation, I am more suspicious for costochondritis Discussed that she can continue to treat GERD with pantoprazole, over-the-counter Tums/Mylanta/Maalox as needed We discussed costochondritis can take weeks to fully resolve, recommended use of heat packs, Tylenol extra strength every 6 hours as needed for pain ER and return precautions discussed Recommended following up with primary care assistance to establish care with a primary care provider Note given for work  The patient was given the opportunity to ask questions.  All questions answered to their  satisfaction.  The patient is in agreement to this plan.    Final Clinical Impressions(s) / UC Diagnoses  Final diagnoses:  Chest wall pain  Costochondritis     Discharge Instructions      Urine pregnancy test today is negative  We have given you a dose of Maalox/Mylanta which sounds like it helped with the pain in your upper stomach/lower chest.  This is over-the-counter and you can continue this at home.  You can also take Tums to help with the discomfort.  Lastly, I recommend taking Tylenol 500 to 1000 mg every 6 hours as needed for the pain that goes out to the sides of your rib cage.  Call the phone number that was sent to you via MyChart message to schedule appointment the primary care provider.   ED Prescriptions   None    PDMP not reviewed this encounter.   Valentino Nose, NP 09/23/22 1945

## 2022-09-23 NOTE — ED Triage Notes (Signed)
Pt was seen for the same pain on 09/15/2022, She was given protonix to take QD. She has been taking it in the morning when she wakes up. Two hours ago she got chest pain again and she drank some hot tea and it made the tightness better. She states she is still having the chest pain but not as bad.

## 2022-09-23 NOTE — Discharge Instructions (Addendum)
Urine pregnancy test today is negative  We have given you a dose of Maalox/Mylanta which sounds like it helped with the pain in your upper stomach/lower chest.  This is over-the-counter and you can continue this at home.  You can also take Tums to help with the discomfort.  Lastly, I recommend taking Tylenol 500 to 1000 mg every 6 hours as needed for the pain that goes out to the sides of your rib cage.  Call the phone number that was sent to you via MyChart message to schedule appointment the primary care provider.

## 2022-10-09 ENCOUNTER — Encounter: Payer: Self-pay | Admitting: Obstetrics and Gynecology

## 2022-10-09 ENCOUNTER — Other Ambulatory Visit: Payer: Self-pay | Admitting: *Deleted

## 2022-10-09 MED ORDER — FLUCONAZOLE 150 MG PO TABS: 150.0000 mg | ORAL_TABLET | Freq: Once | ORAL | 0 refills | Status: AC

## 2022-10-28 ENCOUNTER — Ambulatory Visit: Payer: Medicaid Other

## 2022-10-29 ENCOUNTER — Ambulatory Visit (INDEPENDENT_AMBULATORY_CARE_PROVIDER_SITE_OTHER): Payer: Medicaid Other

## 2022-10-29 ENCOUNTER — Other Ambulatory Visit: Payer: Self-pay

## 2022-10-29 VITALS — BP 105/69 | HR 91 | Ht 68.0 in | Wt 147.3 lb

## 2022-10-29 DIAGNOSIS — Z3042 Encounter for surveillance of injectable contraceptive: Secondary | ICD-10-CM | POA: Diagnosis not present

## 2022-10-29 LAB — POCT PREGNANCY, URINE: Preg Test, Ur: NEGATIVE

## 2022-10-29 MED ORDER — MEDROXYPROGESTERONE ACETATE 150 MG/ML IM SUSP
150.0000 mg | Freq: Once | INTRAMUSCULAR | Status: AC
  Administered 2022-10-29: 150 mg via INTRAMUSCULAR

## 2022-10-29 NOTE — Progress Notes (Signed)
Tricia Welch here for Depo-Provera Injection. Last depo administered on 08/01/22; [redacted]w[redacted]d since last administration. Patient reported unprotected intercourse approximately 2 days ago. UPT obtained per protocol--negative.    Injection administered without complication. Patient will return in 3 months for next injection between 01/15/23 and 01/29/23. Next annual visit due 08/02/23.    Meryl Crutch, RN 10/29/2022  9:46 AM

## 2022-12-29 IMAGING — US US OB COMP LESS 14 WK
1 series · 15 of 28 positions shown · non-contrast
Comparison: None.

CLINICAL DATA: Vaginal bleeding. Quantitative beta HCG is pending.
LMP was 09/08/2021. EDC by LMP is 06/15/2022.

EXAM:
OBSTETRIC <14 WK ULTRASOUND
TECHNIQUE: Transabdominal ultrasound was performed for evaluation of the
gestation as well as the maternal uterus and adnexal regions.

[Series 1: us ob comp less 14 wk · 15 of 36 slices shown]
[im 1/36]
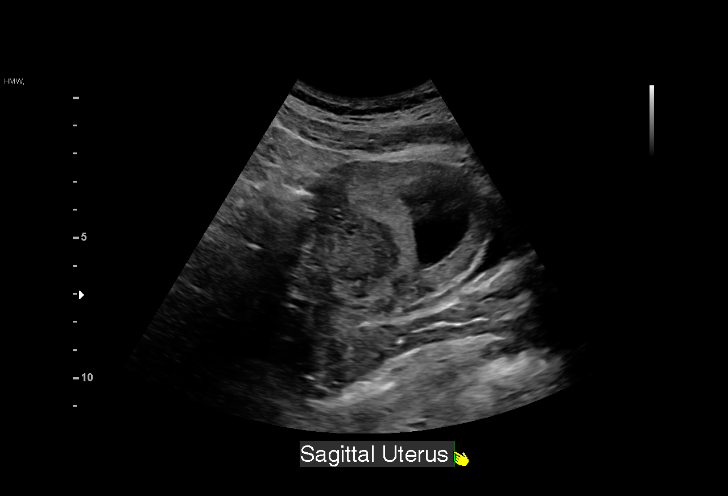
[im 3/36]
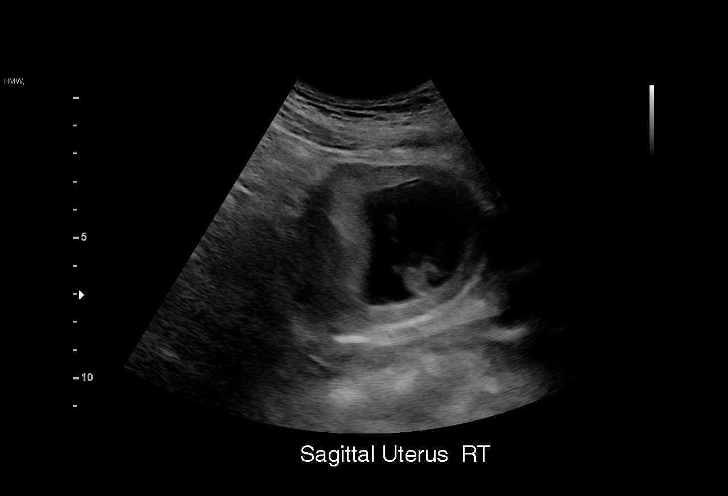
[im 6/36]
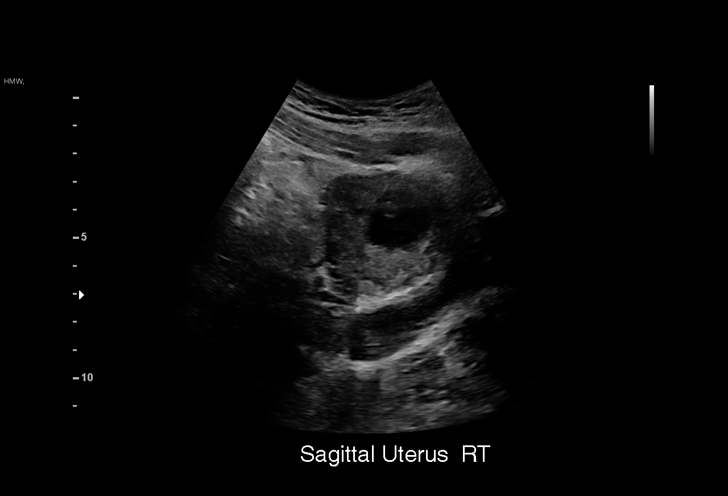
[im 8/36]
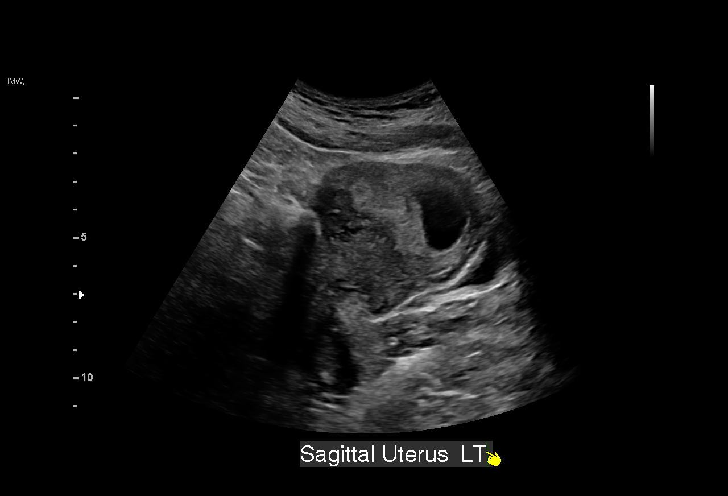
[im 11/36]
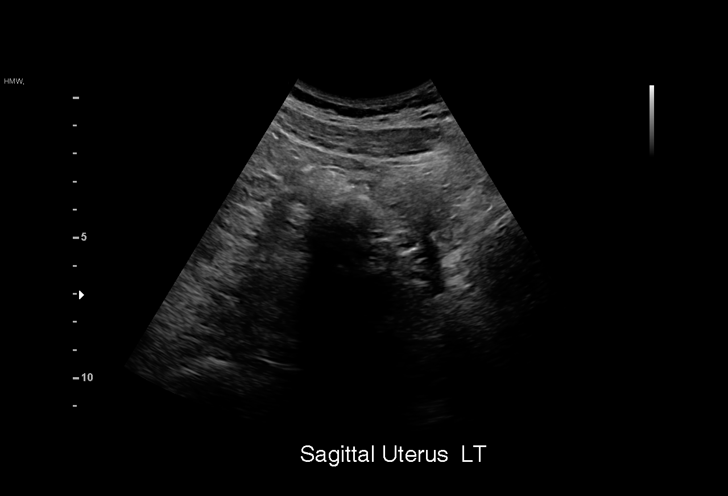
[im 13/36]
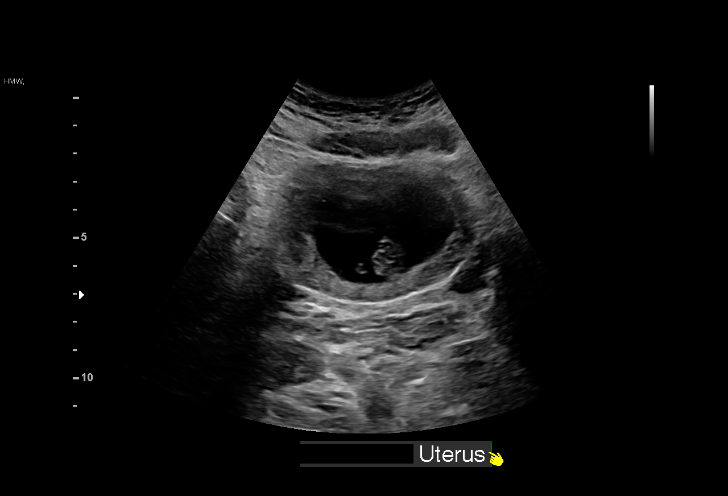
[im 16/36]
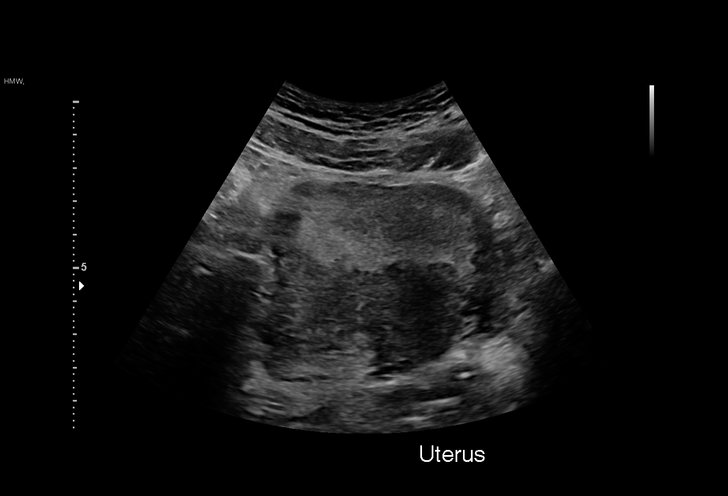
[im 19/36]
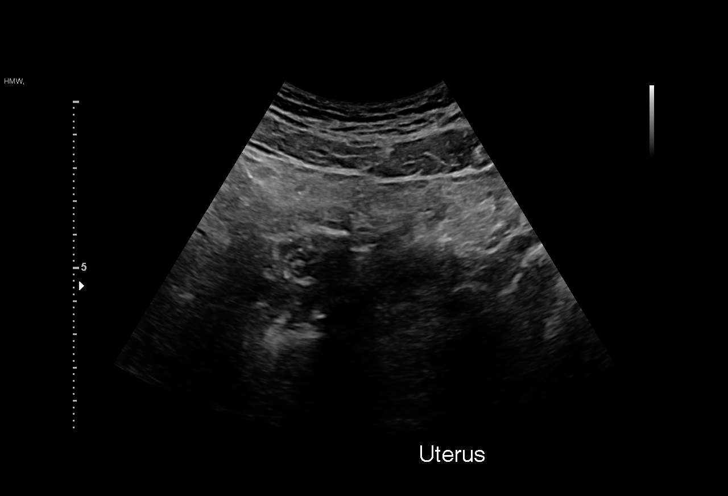
[im 20/36]
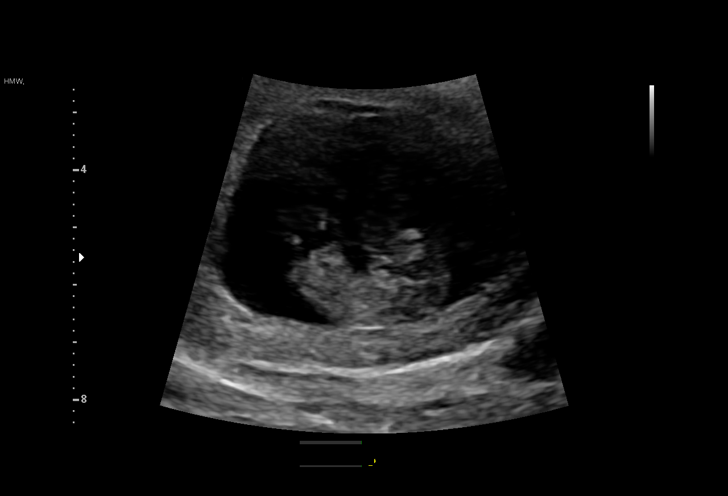
[im 23/36]
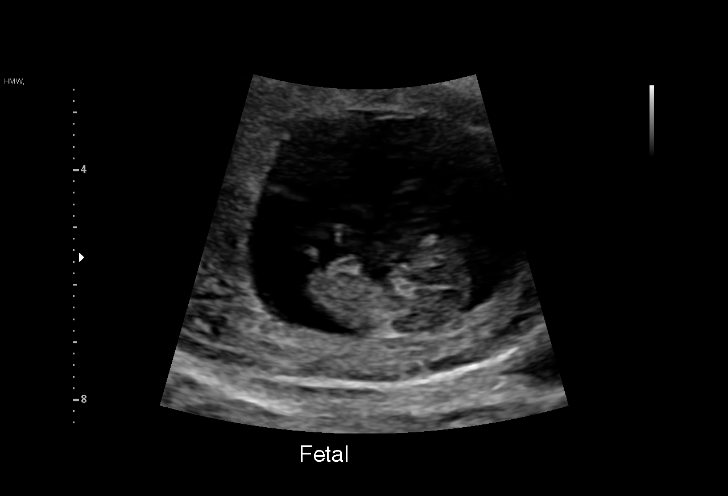
[im 25/36]
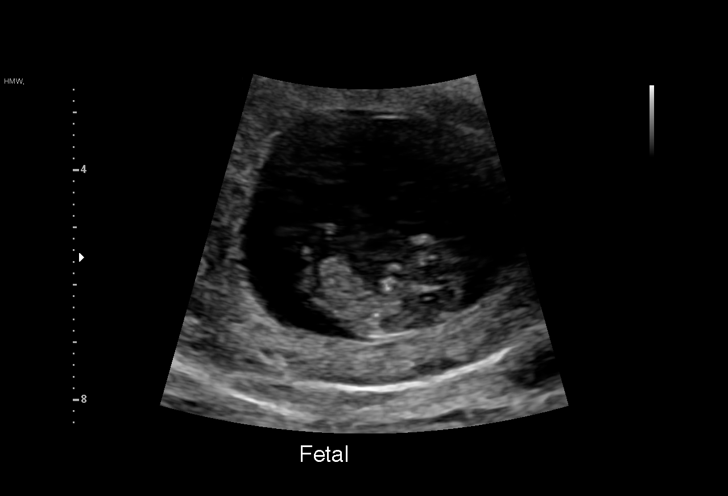
[im 28/36]
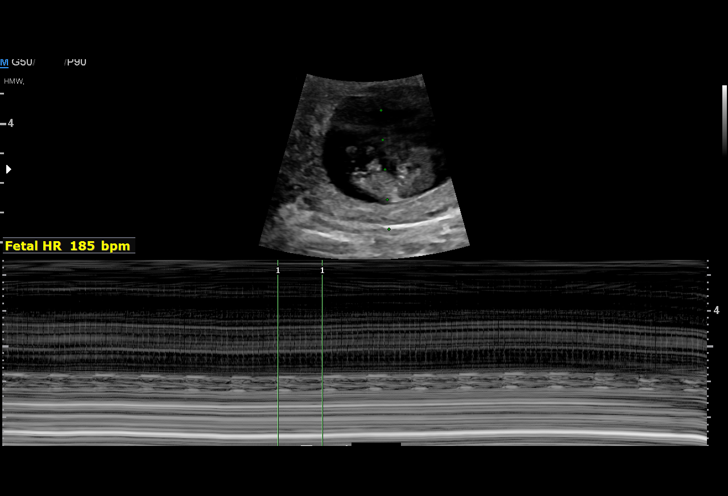
[im 30/36]
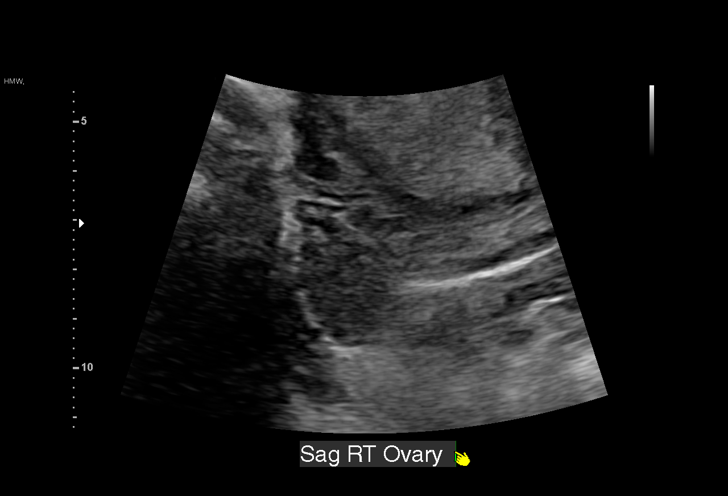
[im 33/36]
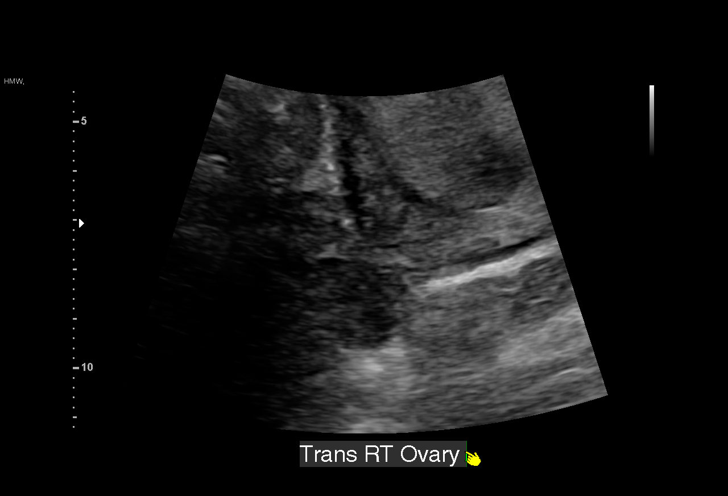
[im 36/36]
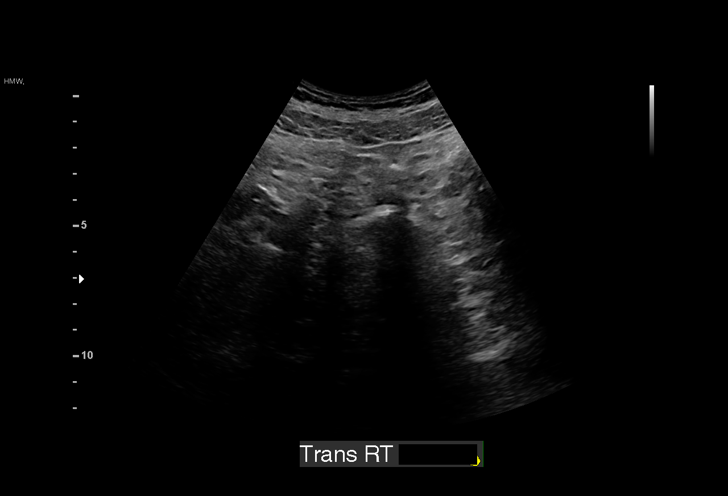

[15 of 28 positions shown; findings below may reference images not displayed]

FINDINGS: Intrauterine gestational sac: Single

Yolk sac:  Not Visualized.

Embryo:  Visualized.

Cardiac Activity: Visualized.

Heart Rate: 185 bpm

CRL:   28.8 mm   9 w 4 d                  US EDC: 06/19/2022

Subchorionic hemorrhage:  None visualized.

Maternal uterus/adnexae: Normal appearance of the RIGHT ovary. LEFT
ovary not seen. No free pelvic fluid.
IMPRESSION: Single living intrauterine embryo measuring 9 weeks 4 days by
ultrasound.

Size and dates correlate within 4 days.

## 2023-01-14 NOTE — Progress Notes (Unsigned)
Tricia Welch here for Depo-Provera Injection. Patient received last dose on 10/29/22; she is 64w1dsince last injection; patient within window for next dose. Patient requested a UPT "just in case"; last time patient remembers having unprotected sex was 3 weeks ago. UPT in office today was negative.  Injection administered without complication. Patient will return in 3 months for next injection between 5/30 and 6/13. Next annual visit due 08/01/25.     VAlinda Money RN 01/14/2023  5:32 PM

## 2023-01-15 ENCOUNTER — Other Ambulatory Visit: Payer: Self-pay

## 2023-01-15 ENCOUNTER — Ambulatory Visit (INDEPENDENT_AMBULATORY_CARE_PROVIDER_SITE_OTHER): Payer: Medicaid Other

## 2023-01-15 VITALS — BP 106/70 | HR 84 | Ht 68.0 in | Wt 149.8 lb

## 2023-01-15 DIAGNOSIS — Z3042 Encounter for surveillance of injectable contraceptive: Secondary | ICD-10-CM

## 2023-01-15 LAB — POCT PREGNANCY, URINE: Preg Test, Ur: NEGATIVE

## 2023-01-15 MED ORDER — MEDROXYPROGESTERONE ACETATE 150 MG/ML IM SUSP
150.0000 mg | Freq: Once | INTRAMUSCULAR | Status: AC
  Administered 2023-01-15: 150 mg via INTRAMUSCULAR

## 2023-01-25 ENCOUNTER — Encounter: Payer: Self-pay | Admitting: Obstetrics and Gynecology

## 2023-01-28 ENCOUNTER — Other Ambulatory Visit (HOSPITAL_COMMUNITY)
Admission: RE | Admit: 2023-01-28 | Discharge: 2023-01-28 | Disposition: A | Discharge status: Home / Self Care | Payer: Medicaid Other | Source: Ambulatory Visit | Attending: Family Medicine | Admitting: Family Medicine

## 2023-01-28 ENCOUNTER — Encounter (INDEPENDENT_AMBULATORY_CARE_PROVIDER_SITE_OTHER): Payer: Self-pay

## 2023-01-28 ENCOUNTER — Ambulatory Visit (INDEPENDENT_AMBULATORY_CARE_PROVIDER_SITE_OTHER): Payer: Medicaid Other | Admitting: General Practice

## 2023-01-28 ENCOUNTER — Other Ambulatory Visit: Payer: Self-pay

## 2023-01-28 VITALS — BP 115/82 | HR 88 | Ht 68.0 in | Wt 146.0 lb

## 2023-01-28 DIAGNOSIS — N898 Other specified noninflammatory disorders of vagina: Secondary | ICD-10-CM | POA: Diagnosis present

## 2023-01-28 NOTE — Progress Notes (Signed)
Patient presents to office today requesting self swab due to vaginal irritation. She reports vaginal itching and irritation with intercourse. She had a thick white discharge last week that resolved with Monistat 3 but the irritation/itching has continued. She would also like STD testing. Patient was instructed in self swab and specimen collected. Advised results will be back in 24-48 hours and available via mychart.   Koren Bound RN BSN 01/28/23

## 2023-01-29 LAB — CERVICOVAGINAL ANCILLARY ONLY
Bacterial Vaginitis (gardnerella): POSITIVE — AB
Candida Glabrata: NEGATIVE
Candida Vaginitis: POSITIVE — AB
Chlamydia: NEGATIVE
Comment: NEGATIVE
Comment: NEGATIVE
Comment: NEGATIVE
Comment: NEGATIVE
Comment: NEGATIVE
Comment: NORMAL
Neisseria Gonorrhea: NEGATIVE
Trichomonas: NEGATIVE

## 2023-02-03 ENCOUNTER — Other Ambulatory Visit: Payer: Self-pay | Admitting: Certified Nurse Midwife

## 2023-02-03 DIAGNOSIS — B3731 Acute candidiasis of vulva and vagina: Secondary | ICD-10-CM

## 2023-02-03 DIAGNOSIS — N760 Acute vaginitis: Secondary | ICD-10-CM

## 2023-02-03 MED ORDER — METRONIDAZOLE 500 MG PO TABS: 500.0000 mg | ORAL_TABLET | Freq: Two times a day (BID) | ORAL | 0 refills | Status: DC

## 2023-02-03 MED ORDER — FLUCONAZOLE 150 MG PO TABS: 150.0000 mg | ORAL_TABLET | Freq: Every day | ORAL | 1 refills | Status: DC

## 2023-02-06 ENCOUNTER — Encounter: Payer: Self-pay | Admitting: Certified Nurse Midwife

## 2023-04-02 ENCOUNTER — Ambulatory Visit: Payer: Medicaid Other

## 2023-05-02 ENCOUNTER — Encounter: Payer: Self-pay | Admitting: Obstetrics and Gynecology

## 2023-05-04 ENCOUNTER — Other Ambulatory Visit: Payer: Self-pay

## 2023-05-04 ENCOUNTER — Ambulatory Visit (INDEPENDENT_AMBULATORY_CARE_PROVIDER_SITE_OTHER): Payer: Medicaid Other | Admitting: *Deleted

## 2023-05-04 VITALS — BP 109/68 | HR 69 | Ht 68.0 in | Wt 144.6 lb

## 2023-05-04 DIAGNOSIS — R350 Frequency of micturition: Secondary | ICD-10-CM | POA: Diagnosis not present

## 2023-05-04 DIAGNOSIS — R300 Dysuria: Secondary | ICD-10-CM

## 2023-05-04 LAB — POCT URINALYSIS DIP (DEVICE)
Bilirubin Urine: NEGATIVE
Glucose, UA: NEGATIVE mg/dL
Ketones, ur: NEGATIVE mg/dL
Nitrite: NEGATIVE
Protein, ur: 30 mg/dL — AB
Specific Gravity, Urine: >=1.03 (ref 1.005–1.030)
Urobilinogen, UA: 0.2 mg/dL (ref 0.0–1.0)
pH: 6 (ref 5.0–8.0)

## 2023-05-04 MED ORDER — PHENAZOPYRIDINE HCL 200 MG PO TABS: 200.0000 mg | ORAL_TABLET | Freq: Three times a day (TID) | ORAL | 0 refills | Status: DC | PRN

## 2023-05-04 MED ORDER — NITROFURANTOIN MONOHYD MACRO 100 MG PO CAPS: 100.0000 mg | ORAL_CAPSULE | Freq: Two times a day (BID) | ORAL | 0 refills | Status: AC

## 2023-05-04 NOTE — Progress Notes (Signed)
Pt reports urinary frequency and painful urination x2 days. She has also noticed that her urine is cloudy.  POC UA obtained and shows small Leukocytes and small blood. Urine culture sent to lab. Rx for Macrobid and Pyridium e-prescribed per standing order.  Pt was advised that she will be notified of culture results via Mychart and any further medications if necessary.  Pt voiced understanding of all information and instructions given.

## 2023-05-07 LAB — URINE CULTURE

## 2023-05-08 ENCOUNTER — Other Ambulatory Visit (HOSPITAL_BASED_OUTPATIENT_CLINIC_OR_DEPARTMENT_OTHER): Payer: Self-pay | Admitting: Obstetrics & Gynecology

## 2023-05-08 DIAGNOSIS — N3000 Acute cystitis without hematuria: Secondary | ICD-10-CM

## 2023-05-08 MED ORDER — SULFAMETHOXAZOLE-TRIMETHOPRIM 800-160 MG PO TABS: 1.0000 | ORAL_TABLET | Freq: Two times a day (BID) | ORAL | 0 refills | Status: DC

## 2023-05-11 ENCOUNTER — Telehealth: Payer: Self-pay | Admitting: *Deleted

## 2023-05-11 NOTE — Telephone Encounter (Signed)
I called patient and she reports she is feeling better. States she is not having pain with urination and urine is not cloudy. States she is having some frequency still. States she feels like something pushing down when she pees. She has one more bactrim to take. She has upcoming appt 7/15 for depoprovera. I explained we can do another urine then as well if needed , but if anything worsens between now and then to call us back. We also discussed she may need an MD appointment for the feeling of something pushing down continues. She voices understanding. Nancy Fetter

## 2023-05-11 NOTE — Telephone Encounter (Signed)
-----   Message from Jerene Bears, MD sent at 05/08/2023  3:10 PM EDT ----- Called pt personally and she was identified by two identifiers.  Reports symptoms are still present and really not any better.  No fevers.  Having some lower back pain.  Confirmed no allergies.  Changed abx to bactrim DS BID x 3 days.  Can she be called on Monday to make sure she is better?  [facility].  I am covering for Dr. Briscoe Deutscher.  Thanks.

## 2023-05-18 ENCOUNTER — Ambulatory Visit: Payer: Medicaid Other

## 2023-05-18 VITALS — BP 103/68 | HR 75 | Wt 143.1 lb

## 2023-05-18 DIAGNOSIS — Z3042 Encounter for surveillance of injectable contraceptive: Secondary | ICD-10-CM | POA: Diagnosis not present

## 2023-05-18 LAB — POCT PREGNANCY, URINE: Preg Test, Ur: NEGATIVE

## 2023-05-18 MED ORDER — MEDROXYPROGESTERONE ACETATE 150 MG/ML IM SUSY
150.0000 mg | PREFILLED_SYRINGE | Freq: Once | INTRAMUSCULAR | Status: AC
  Administered 2023-05-18: 150 mg via INTRAMUSCULAR

## 2023-05-18 NOTE — Progress Notes (Signed)
Tricia Welch here for Depo-Provera Injection. Last Depo Provera given 01/15/23. UPT today is negative. Denies unprotected intercourse for past 2 weeks. Injection administered without complication. Reviewed need for back up method contraception for 2 weeks. Patient will return in 3 months for next injection between 08/03/23 and 08/27/23. Next annual visit due September 2024.   Recent E. coli UTI. Pt finished antibiotic treatment last Monday, 05/11/23. Reports symptoms are much improved. No longer having any burning or pain. Describes sensation of pressure with urination: "something is pressing against me. Desnies symptoms of yeast infection. Encouraged pt to follow up with office if there is worsening or if this does not resolve by next week so we can consider a repeat urine culture.   Marjo Bicker, RN 05/18/2023  10:45 AM

## 2023-06-01 ENCOUNTER — Encounter (HOSPITAL_COMMUNITY): Payer: Self-pay | Admitting: Emergency Medicine

## 2023-06-01 ENCOUNTER — Ambulatory Visit (HOSPITAL_COMMUNITY)
Admission: EM | Admit: 2023-06-01 | Discharge: 2023-06-01 | Disposition: A | Discharge status: Home / Self Care | Payer: Medicaid Other | Attending: Physician Assistant | Admitting: Physician Assistant

## 2023-06-01 DIAGNOSIS — R519 Headache, unspecified: Secondary | ICD-10-CM

## 2023-06-01 MED ORDER — SUMATRIPTAN SUCCINATE 50 MG PO TABS: 50.0000 mg | ORAL_TABLET | ORAL | 0 refills | Status: AC | PRN

## 2023-06-01 MED ORDER — KETOROLAC TROMETHAMINE 30 MG/ML IJ SOLN
INTRAMUSCULAR | Status: AC
  Filled 2023-06-01: qty 1

## 2023-06-01 MED ORDER — KETOROLAC TROMETHAMINE 30 MG/ML IJ SOLN
30.0000 mg | Freq: Once | INTRAMUSCULAR | Status: AC
  Administered 2023-06-01: 30 mg via INTRAMUSCULAR

## 2023-06-01 NOTE — ED Triage Notes (Signed)
Pt c/o constant headache that has been ongoing for a few days. Tried taking Ibuprofen, Goody powder, and Excedrin without relief. Reports sees spots.

## 2023-06-01 NOTE — Discharge Instructions (Signed)
Can take Imitrex as needed for migraine Drink plenty of water, rest.  Can apply ice pack

## 2023-06-01 NOTE — ED Provider Notes (Signed)
[facility]    CSN: [id number] Arrival date & time: 06/01/23  1707      History   Chief Complaint Chief Complaint  Patient presents with   Headache    HPI Tricia Welch is a 22 y.o. female.   Patient complains of headache for the last 3 to 4 days.  She reports no resolution with use of ibuprofen, Excedrin, Goody powder.  She denies nausea/vomiting.  She reports seeing some spots periodically and experiencing photophobia.  She has a history of migraines in the past.  Has been prescribed Maxalt previously and reports this had provided no relief in the past.  She no longer has this medication.      Past Medical History:  Diagnosis Date   Anemia     Patient Active Problem List   Diagnosis Date Noted   Intractable migraine without aura and with status migrainosus 03/08/2020    Past Surgical History:  Procedure Laterality Date   TONSILLECTOMY      OB History     Gravida  1   Para  1   Term  1   Preterm      AB      Living  1      SAB      IAB      Ectopic      Multiple  0   Live Births  1            Home Medications    Prior to Admission medications   Medication Sig Start Date End Date Taking? Authorizing Provider  SUMAtriptan (IMITREX) 50 MG tablet Take 1 tablet (50 mg total) by mouth every 2 (two) hours as needed for migraine. May repeat in 2 hours if headache persists or recurs. 06/01/23  Yes Ward, Tylene Fantasia, PA-C  Acetaminophen (TYLENOL 8 HOUR PO) Take by mouth. Patient not taking: Reported on 05/04/2023    [provider]    Family History Family History  Problem Relation Age of Onset   Diabetes Maternal Grandmother    Diabetes Maternal Grandfather    Diabetes Paternal Grandmother    Diabetes Paternal Grandfather     Social History Social History   Tobacco Use   Smoking status: Never    Passive exposure: Never   Smokeless tobacco: Never  Vaping Use   Vaping status: Never Used  Substance Use Topics    Alcohol use: Never   Drug use: Never     Allergies   Patient has no known allergies.   Review of Systems Review of Systems  Constitutional:  Negative for chills and fever.  HENT:  Negative for ear pain and sore throat.   Eyes:  Negative for pain and visual disturbance.  Respiratory:  Negative for cough and shortness of breath.   Cardiovascular:  Negative for chest pain and palpitations.  Gastrointestinal:  Negative for abdominal pain and vomiting.  Genitourinary:  Negative for dysuria and hematuria.  Musculoskeletal:  Negative for arthralgias and back pain.  Skin:  Negative for color change and rash.  Neurological:  Positive for headaches. Negative for seizures and syncope.  All other systems reviewed and are negative.    Physical Exam Triage Vital Signs ED Triage Vitals [06/01/23 1728]  Encounter Vitals Group     BP 111/74     Systolic BP Percentile      Diastolic BP Percentile      Pulse Rate 91     Resp 16     Temp 98.1  F (36.7 C)     Temp Source Oral     SpO2 98 %     Weight      Height      Head Circumference      Peak Flow      Pain Score 7     Pain Loc      Pain Education      Exclude from Growth Chart    No data found.  Updated Vital Signs BP 111/74 (BP Location: Left Arm)   Pulse 91   Temp 98.1 F (36.7 C) (Oral)   Resp 16   SpO2 98%   Visual Acuity Right Eye Distance:   Left Eye Distance:   Bilateral Distance:    Right Eye Near:   Left Eye Near:    Bilateral Near:     Physical Exam Vitals and nursing note reviewed.  Constitutional:      General: She is not in acute distress.    Appearance: She is well-developed.  HENT:     Head: Normocephalic and atraumatic.  Eyes:     Conjunctiva/sclera: Conjunctivae normal.  Cardiovascular:     Rate and Rhythm: Normal rate and regular rhythm.     Heart sounds: No murmur heard. Pulmonary:     Effort: Pulmonary effort is normal. No respiratory distress.     Breath sounds: Normal breath sounds.   Abdominal:     Palpations: Abdomen is soft.     Tenderness: There is no abdominal tenderness.  Musculoskeletal:        General: No swelling.     Cervical back: Neck supple.  Skin:    General: Skin is warm and dry.     Capillary Refill: Capillary refill takes less than 2 seconds.  Neurological:     Mental Status: She is alert.  Psychiatric:        Mood and Affect: Mood normal.      UC Treatments / Results  Labs (all labs ordered are listed, but only abnormal results are displayed) Labs Reviewed - No data to display  EKG   Radiology No results found.  Procedures Procedures (including critical care time)  Medications Ordered in UC Medications  ketorolac (TORADOL) 30 MG/ML injection 30 mg (30 mg Intramuscular Given 06/01/23 1750)    Initial Impression / Assessment and Plan / UC Course  I have reviewed the triage vital signs and the nursing notes.  Pertinent labs & imaging results that were available during my care of the patient were reviewed by me and considered in my medical decision making (see chart for details).     Headache.  Toradol given in clinic today with some relief.  Patient overall well-appearing in no acute distress.  Neuroexam normal. Will send in a prescription for sumatriptan.  Final Clinical Impressions(s) / UC Diagnoses   Final diagnoses:  Acute intractable headache, unspecified headache type     Discharge Instructions      Can take Imitrex as needed for migraine Drink plenty of water, rest.  Can apply ice pack     ED Prescriptions     Medication Sig Dispense Auth. Provider   SUMAtriptan (IMITREX) 50 MG tablet Take 1 tablet (50 mg total) by mouth every 2 (two) hours as needed for migraine. May repeat in 2 hours if headache persists or recurs. 10 tablet Ward, Tylene Fantasia, PA-C      PDMP not reviewed this encounter.   Ward, Tylene Fantasia, PA-C 06/01/23 1827

## 2023-07-06 ENCOUNTER — Encounter (HOSPITAL_COMMUNITY): Payer: Self-pay | Admitting: Emergency Medicine

## 2023-07-06 ENCOUNTER — Ambulatory Visit (HOSPITAL_COMMUNITY)
Admission: EM | Admit: 2023-07-06 | Discharge: 2023-07-06 | Disposition: A | Discharge status: Home / Self Care | Payer: Medicaid Other | Attending: Internal Medicine | Admitting: Internal Medicine

## 2023-07-06 DIAGNOSIS — Z1152 Encounter for screening for COVID-19: Secondary | ICD-10-CM | POA: Diagnosis not present

## 2023-07-06 DIAGNOSIS — J069 Acute upper respiratory infection, unspecified: Secondary | ICD-10-CM | POA: Insufficient documentation

## 2023-07-06 DIAGNOSIS — R1110 Vomiting, unspecified: Secondary | ICD-10-CM | POA: Diagnosis present

## 2023-07-06 DIAGNOSIS — J029 Acute pharyngitis, unspecified: Secondary | ICD-10-CM | POA: Diagnosis present

## 2023-07-06 DIAGNOSIS — R059 Cough, unspecified: Secondary | ICD-10-CM | POA: Diagnosis present

## 2023-07-06 MED ORDER — PSEUDOEPHEDRINE HCL 60 MG PO TABS: 60.0000 mg | ORAL_TABLET | Freq: Three times a day (TID) | ORAL | 0 refills | Status: AC | PRN

## 2023-07-06 MED ORDER — PROMETHAZINE-DM 6.25-15 MG/5ML PO SYRP: 5.0000 mL | ORAL_SOLUTION | Freq: Four times a day (QID) | ORAL | 0 refills | Status: DC | PRN

## 2023-07-06 MED ORDER — CETIRIZINE HCL 10 MG PO TABS: 10.0000 mg | ORAL_TABLET | Freq: Every day | ORAL | 1 refills | Status: AC

## 2023-07-06 NOTE — Discharge Instructions (Signed)
Start Promethazine DM 5 mL 4 times daily as needed for cough.  Use caution with driving as this can make you drowsy.  May alternate Tylenol and ibuprofen for body aches and headache. May use sudafed for congestion if needed. Try zyrtec daily to help relieve symptoms.  Follow-up in urgent care if symptoms worsen or fail to improve.

## 2023-07-06 NOTE — ED Provider Notes (Signed)
[facility]    CSN: [id number] Arrival date & time: 07/06/23  2536      History   Chief Complaint Chief Complaint  Patient presents with   Cough    HPI Tricia Welch is a 22 y.o. female.   22 year old female who presents to urgent care with complaints of cough, headaches and sore throat.  She reports that yesterday she was at the mall with her [place] and started coughing.  When she got home the coughing caused her to vomit several times.  This is also been associated with a fairly severe headache and a sore throat that makes it somewhat difficult to swallow.  She was having trouble sleeping last night due to cough.  She has had some mild chills last night but not today.  She has not had any fevers, diarrhea, shortness of breath, chest pain, wheezing or other constitutional symptoms.  She does have a 27-year-old at home.   Cough Associated symptoms: chills, headaches and sore throat   Associated symptoms: no chest pain, no ear pain, no fever, no rash and no shortness of breath     Past Medical History:  Diagnosis Date   Anemia     Patient Active Problem List   Diagnosis Date Noted   Intractable migraine without aura and with status migrainosus 03/08/2020    Past Surgical History:  Procedure Laterality Date   TONSILLECTOMY      OB History     Gravida  1   Para  1   Term  1   Preterm      AB      Living  1      SAB      IAB      Ectopic      Multiple  0   Live Births  1            Home Medications    Prior to Admission medications   Medication Sig Start Date End Date Taking? Authorizing Provider  SUMAtriptan (IMITREX) 50 MG tablet Take 1 tablet (50 mg total) by mouth every 2 (two) hours as needed for migraine. May repeat in 2 hours if headache persists or recurs. 06/01/23  Yes Ward, Tylene Fantasia, PA-C  Acetaminophen (TYLENOL 8 HOUR PO) Take by mouth. Patient not taking: Reported on 05/04/2023    [provider]    Family  History Family History  Problem Relation Age of Onset   Diabetes Maternal Grandmother    Diabetes Maternal Grandfather    Diabetes Paternal Grandmother    Diabetes Paternal Grandfather     Social History Social History   Tobacco Use   Smoking status: Never    Passive exposure: Never   Smokeless tobacco: Never  Vaping Use   Vaping status: Never Used  Substance Use Topics   Alcohol use: Never   Drug use: Never     Allergies   Patient has no known allergies.   Review of Systems Review of Systems  Constitutional:  Positive for chills. Negative for fever.  HENT:  Positive for sinus pain and sore throat. Negative for ear pain.   Eyes:  Negative for pain and visual disturbance.  Respiratory:  Positive for cough. Negative for shortness of breath.   Cardiovascular:  Negative for chest pain and palpitations.  Gastrointestinal:  Positive for nausea and vomiting. Negative for abdominal pain and diarrhea.  Genitourinary:  Negative for dysuria and hematuria.  Musculoskeletal:  Negative for arthralgias and back pain.  Skin:  Negative for color change and rash.  Neurological:  Positive for headaches. Negative for seizures and syncope.  All other systems reviewed and are negative.    Physical Exam Triage Vital Signs ED Triage Vitals  Encounter Vitals Group     BP 07/06/23 0823 114/76     Systolic BP Percentile --      Diastolic BP Percentile --      Pulse Rate 07/06/23 0823 90     Resp 07/06/23 0823 16     Temp 07/06/23 0823 98.5 F (36.9 C)     Temp Source 07/06/23 0823 Oral     SpO2 07/06/23 0823 97 %     Weight 07/06/23 0824 144 lb (65.3 kg)     Height 07/06/23 0824 5\' 8"  (1.727 m)     Head Circumference --      Peak Flow --      Pain Score 07/06/23 0824 7     Pain Loc --      Pain Education --      Exclude from Growth Chart --    No data found.  Updated Vital Signs BP 114/76 (BP Location: Left Arm)   Pulse 90   Temp 98.5 F (36.9 C) (Oral)   Resp 16   Ht  5\' 8"  (1.727 m)   Wt 144 lb (65.3 kg)   SpO2 97%   Breastfeeding No   BMI 21.90 kg/m   Visual Acuity Right Eye Distance:   Left Eye Distance:   Bilateral Distance:    Right Eye Near:   Left Eye Near:    Bilateral Near:     Physical Exam Vitals and nursing note reviewed.  Constitutional:      General: She is not in acute distress.    Appearance: She is well-developed.  HENT:     Head: Normocephalic and atraumatic.     Right Ear: Tympanic membrane normal.     Left Ear: Tympanic membrane normal.     Nose: Congestion present.     Mouth/Throat:     Mouth: Mucous membranes are moist.     Pharynx: Posterior oropharyngeal erythema (Mild) present.  Eyes:     Conjunctiva/sclera: Conjunctivae normal.  Cardiovascular:     Rate and Rhythm: Normal rate and regular rhythm.     Heart sounds: No murmur heard. Pulmonary:     Effort: Pulmonary effort is normal. No respiratory distress.     Breath sounds: Normal breath sounds.  Abdominal:     Palpations: Abdomen is soft.     Tenderness: There is no abdominal tenderness.  Musculoskeletal:        General: No swelling.     Cervical back: Neck supple.  Skin:    General: Skin is warm and dry.     Capillary Refill: Capillary refill takes less than 2 seconds.  Neurological:     Mental Status: She is alert.  Psychiatric:        Mood and Affect: Mood normal.      UC Treatments / Results  Labs (all labs ordered are listed, but only abnormal results are displayed) Labs Reviewed - No data to display  EKG   Radiology No results found.  Procedures Procedures (including critical care time)  Medications Ordered in UC Medications - No data to display  Initial Impression / Assessment and Plan / UC Course  I have reviewed the triage vital signs and the nursing notes.  Pertinent labs & imaging results that were available during my care of  the patient were reviewed by me and considered in my medical decision making (see chart for  details).     Viral upper respiratory tract infection: COVID test obtained today and these results be available in 24 hours.  If they are positive we will contact you.  Advised patient to stay hydrated and to rest as needed.  Will call in Promethazine DM to help with the cough as well as Sudafed for the congestion.  May use alternating ibuprofen and Tylenol for discomfort or headache.  Return to urgent care if symptoms worsen or fail to improve. Final Clinical Impressions(s) / UC Diagnoses   Final diagnoses:  None   Discharge Instructions   None    ED Prescriptions   None    PDMP not reviewed this encounter.   Landis Martins, [place] 07/06/23 [phone]

## 2023-07-06 NOTE — ED Triage Notes (Signed)
Patient c/o sore throat, cough and vomiting x 1 day.  Patient has taken Mucinex and Ibuprofen for sx's.

## 2023-07-07 LAB — SARS CORONAVIRUS 2 (TAT 6-24 HRS): SARS Coronavirus 2: NEGATIVE

## 2023-08-12 ENCOUNTER — Encounter: Payer: Self-pay | Admitting: Obstetrics and Gynecology

## 2023-08-14 ENCOUNTER — Ambulatory Visit (HOSPITAL_COMMUNITY)
Admission: EM | Admit: 2023-08-14 | Discharge: 2023-08-14 | Disposition: A | Discharge status: Home / Self Care | Payer: Medicaid Other | Attending: Emergency Medicine | Admitting: Emergency Medicine

## 2023-08-14 ENCOUNTER — Other Ambulatory Visit: Payer: Self-pay

## 2023-08-14 ENCOUNTER — Encounter (HOSPITAL_COMMUNITY): Payer: Self-pay | Admitting: *Deleted

## 2023-08-14 DIAGNOSIS — N3000 Acute cystitis without hematuria: Secondary | ICD-10-CM | POA: Diagnosis present

## 2023-08-14 LAB — POCT URINALYSIS DIP (MANUAL ENTRY)
Blood, UA: NEGATIVE
Glucose, UA: NEGATIVE mg/dL
Ketones, POC UA: NEGATIVE mg/dL
Nitrite, UA: NEGATIVE
Protein Ur, POC: 100 mg/dL — AB
Spec Grav, UA: 1.025 (ref 1.010–1.025)
Urobilinogen, UA: 2 U/dL — AB
pH, UA: 7 (ref 5.0–8.0)

## 2023-08-14 LAB — POCT URINE PREGNANCY: Preg Test, Ur: NEGATIVE

## 2023-08-14 MED ORDER — NITROFURANTOIN MONOHYD MACRO 100 MG PO CAPS: 100.0000 mg | ORAL_CAPSULE | Freq: Two times a day (BID) | ORAL | 0 refills | Status: DC

## 2023-08-14 NOTE — ED Triage Notes (Signed)
Pt reports dysuria ,ABD pain and hematuria for 2 days.

## 2023-08-14 NOTE — ED Provider Notes (Signed)
[facility]    CSN: [id number] Arrival date & time: 08/14/23  1755      History   Chief Complaint Chief Complaint  Patient presents with   Dysuria   Abdominal Pain   Hematuria    HPI Tricia Welch is a 22 y.o. female.   Patient presents with dysuria and hematuria x 2 days.  Patient reports bladder pain upon emptying bladder and intermittent mild nausea.  Denies flank pain, fever, abdominal pain, vomiting, and diarrhea.   Dysuria Associated symptoms: nausea   Associated symptoms: no abdominal pain, no fever, no flank pain, no vaginal discharge and no vomiting   Abdominal Pain Associated symptoms: dysuria, hematuria and nausea   Associated symptoms: no chills, no diarrhea, no fatigue, no fever, no vaginal bleeding, no vaginal discharge and no vomiting   Hematuria Pertinent negatives include no abdominal pain.    Past Medical History:  Diagnosis Date   Anemia     Patient Active Problem List   Diagnosis Date Noted   Intractable migraine without aura and with status migrainosus 03/08/2020    Past Surgical History:  Procedure Laterality Date   TONSILLECTOMY      OB History     Gravida  1   Para  1   Term  1   Preterm      AB      Living  1      SAB      IAB      Ectopic      Multiple  0   Live Births  1            Home Medications    Prior to Admission medications   Medication Sig Start Date End Date Taking? Authorizing Provider  cetirizine (ZYRTEC ALLERGY) 10 MG tablet Take 1 tablet (10 mg total) by mouth daily. 07/06/23  Yes White, Elizabeth A, PA-C  nitrofurantoin, macrocrystal-monohydrate, (MACROBID) 100 MG capsule Take 1 capsule (100 mg total) by mouth 2 (two) times daily. 08/14/23  Yes Wynonia Lawman A, NP  promethazine-dextromethorphan (PROMETHAZINE-DM) 6.25-15 MG/5ML syrup Take 5 mLs by mouth 4 (four) times daily as needed for cough. 07/06/23  Yes White, Elizabeth A, PA-C  pseudoephedrine (SUDAFED) 60 MG tablet Take 1  tablet (60 mg total) by mouth every 8 (eight) hours as needed for congestion. 07/06/23  Yes White, Elizabeth A, PA-C  SUMAtriptan (IMITREX) 50 MG tablet Take 1 tablet (50 mg total) by mouth every 2 (two) hours as needed for migraine. May repeat in 2 hours if headache persists or recurs. 06/01/23  Yes Ward, Tylene Fantasia, PA-C  Acetaminophen (TYLENOL 8 HOUR PO) Take by mouth. Patient not taking: Reported on 05/04/2023    [provider]    Family History Family History  Problem Relation Age of Onset   Diabetes Maternal Grandmother    Diabetes Maternal Grandfather    Diabetes Paternal Grandmother    Diabetes Paternal Grandfather     Social History Social History   Tobacco Use   Smoking status: Never    Passive exposure: Never   Smokeless tobacco: Never  Vaping Use   Vaping status: Never Used  Substance Use Topics   Alcohol use: Never   Drug use: Never     Allergies   Patient has no known allergies.   Review of Systems Review of Systems  Constitutional:  Negative for chills, fatigue and fever.  Gastrointestinal:  Positive for nausea. Negative for abdominal pain, diarrhea and vomiting.  Genitourinary:  Positive  for dysuria, hematuria and urgency. Negative for flank pain, genital sores, vaginal bleeding, vaginal discharge and vaginal pain.     Physical Exam Triage Vital Signs ED Triage Vitals  Encounter Vitals Group     BP 08/14/23 1820 101/72     Systolic BP Percentile --      Diastolic BP Percentile --      Pulse Rate 08/14/23 1820 96     Resp 08/14/23 1820 16     Temp 08/14/23 1820 98.1 F (36.7 C)     Temp src --      SpO2 08/14/23 1820 98 %     Weight --      Height --      Head Circumference --      Peak Flow --      Pain Score 08/14/23 1818 7     Pain Loc --      Pain Education --      Exclude from Growth Chart --    No data found.  Updated Vital Signs BP 101/72   Pulse 96   Temp 98.1 F (36.7 C)   Resp 16   SpO2 98%   Visual Acuity Right  Eye Distance:   Left Eye Distance:   Bilateral Distance:    Right Eye Near:   Left Eye Near:    Bilateral Near:     Physical Exam Vitals and nursing note reviewed.  Constitutional:      General: She is awake. She is not in acute distress.    Appearance: Normal appearance. She is well-developed and well-groomed. She is not ill-appearing, toxic-appearing or diaphoretic.  Abdominal:     Tenderness: There is abdominal tenderness in the suprapubic area. There is right CVA tenderness. There is no left CVA tenderness, guarding or rebound.  Neurological:     Mental Status: She is alert.  Psychiatric:        Behavior: Behavior is cooperative.      UC Treatments / Results  Labs (all labs ordered are listed, but only abnormal results are displayed) Labs Reviewed  POCT URINALYSIS DIP (MANUAL ENTRY) - Abnormal; Notable for the following components:      Result Value   Color, UA orange (*)    Clarity, UA cloudy (*)    Bilirubin, UA small (*)    Protein Ur, POC =100 (*)    Urobilinogen, UA 2.0 (*)    Leukocytes, UA Small (1+) (*)    All other components within normal limits  URINE CULTURE  POCT URINE PREGNANCY    EKG   Radiology No results found.  Procedures Procedures (including critical care time)  Medications Ordered in UC Medications - No data to display  Initial Impression / Assessment and Plan / UC Course  I have reviewed the triage vital signs and the nursing notes.  Pertinent labs & imaging results that were available during my care of the patient were reviewed by me and considered in my medical decision making (see chart for details).     Patient presented with 2-day history of dysuria and hematuria.  Patient endorses bladder pain upon emptying bladder and mild intermittent nausea.  Upon assessment she has mild right sided CVA tenderness.  UA revealed small leukocytes, bilirubin, protein.  Without hematuria.  Will send culture.  Urine pregnancy was  negative.  Prescribed Macrobid for UTI coverage.  Discussed return and follow-up precautions. Final Clinical Impressions(s) / UC Diagnoses   Final diagnoses:  Acute cystitis without hematuria  Discharge Instructions      Start taking Macrobid twice daily for 5 days.  Your urine culture results were returned over the next few days and someone will call and adjust treatment as necessary.  Otherwise make sure you are drinking plenty of water and staying hydrated.  Return here as needed.    ED Prescriptions     Medication Sig Dispense Auth. Provider   nitrofurantoin, macrocrystal-monohydrate, (MACROBID) 100 MG capsule Take 1 capsule (100 mg total) by mouth 2 (two) times daily. 10 capsule Wynonia Lawman A, NP      PDMP not reviewed this encounter.   Wynonia Lawman A, NP 08/14/23 1932

## 2023-08-14 NOTE — Discharge Instructions (Signed)
Start taking Macrobid twice daily for 5 days.  Your urine culture results were returned over the next few days and someone will call and adjust treatment as necessary.  Otherwise make sure you are drinking plenty of water and staying hydrated.  Return here as needed.

## 2023-08-16 LAB — URINE CULTURE: Culture: >=100000 — AB

## 2023-09-05 ENCOUNTER — Ambulatory Visit (HOSPITAL_COMMUNITY): Payer: Self-pay

## 2023-09-09 ENCOUNTER — Emergency Department (HOSPITAL_COMMUNITY): Payer: Medicaid Other

## 2023-09-09 ENCOUNTER — Other Ambulatory Visit: Payer: Self-pay

## 2023-09-09 ENCOUNTER — Ambulatory Visit (HOSPITAL_COMMUNITY)
Admission: EM | Admit: 2023-09-09 | Discharge: 2023-09-09 | Disposition: A | Discharge status: Home / Self Care | Payer: Medicaid Other | Attending: Family Medicine | Admitting: Family Medicine

## 2023-09-09 ENCOUNTER — Ambulatory Visit (HOSPITAL_COMMUNITY): Payer: Medicaid Other

## 2023-09-09 ENCOUNTER — Encounter (HOSPITAL_COMMUNITY): Payer: Self-pay | Admitting: Emergency Medicine

## 2023-09-09 ENCOUNTER — Emergency Department (HOSPITAL_COMMUNITY)
Admission: EM | Admit: 2023-09-09 | Discharge: 2023-09-10 | Disposition: A | Discharge status: Home / Self Care | Payer: Medicaid Other | Attending: Emergency Medicine | Admitting: Emergency Medicine

## 2023-09-09 DIAGNOSIS — R0602 Shortness of breath: Secondary | ICD-10-CM

## 2023-09-09 DIAGNOSIS — R042 Hemoptysis: Secondary | ICD-10-CM | POA: Insufficient documentation

## 2023-09-09 LAB — CBC
HCT: 38.5 % (ref 36.0–46.0)
Hemoglobin: 13.1 g/dL (ref 12.0–15.0)
MCH: 30.6 pg (ref 26.0–34.0)
MCHC: 34 g/dL (ref 30.0–36.0)
MCV: 90 fL (ref 80.0–100.0)
Platelets: 248 10*3/uL (ref 150–400)
RBC: 4.28 MIL/uL (ref 3.87–5.11)
RDW: 12.4 % (ref 11.5–15.5)
WBC: 6.1 10*3/uL (ref 4.0–10.5)
nRBC: 0 % (ref 0.0–0.2)

## 2023-09-09 LAB — BASIC METABOLIC PANEL
Anion gap: 11 (ref 5–15)
BUN: 8 mg/dL (ref 6–20)
CO2: 20 mmol/L — ABNORMAL LOW (ref 22–32)
Calcium: 9.3 mg/dL (ref 8.9–10.3)
Chloride: 105 mmol/L (ref 98–111)
Creatinine, Ser: 1.02 mg/dL — ABNORMAL HIGH (ref 0.44–1.00)
GFR, Estimated: >60 mL/min (ref >60)
Glucose, Bld: 118 mg/dL — ABNORMAL HIGH (ref 70–99)
Potassium: 3.7 mmol/L (ref 3.5–5.1)
Sodium: 136 mmol/L (ref 135–145)

## 2023-09-09 LAB — D-DIMER, QUANTITATIVE: D-Dimer, Quant: <0.27 ug{FEU}/mL (ref 0.00–0.50)

## 2023-09-09 LAB — HCG, SERUM, QUALITATIVE: Preg, Serum: NEGATIVE

## 2023-09-09 MED ORDER — ALBUTEROL SULFATE (2.5 MG/3ML) 0.083% IN NEBU
2.5000 mg | INHALATION_SOLUTION | Freq: Once | RESPIRATORY_TRACT | Status: AC
  Administered 2023-09-09: 2.5 mg via RESPIRATORY_TRACT
  Filled 2023-09-09: qty 3

## 2023-09-09 NOTE — ED Triage Notes (Signed)
Sore throat, post nasal drip, coughing up blood clots, described as the size of a nickel. Patient reports resources came and cut areas of mold out of her apartment.  Reports chest is sore, particularly with deep breathing.     Patient has taken benadryl.  Has taken alka seltzer cold and flu medicines

## 2023-09-09 NOTE — ED Notes (Signed)
Patient is being discharged from the Urgent Care and sent to the Emergency Department via pov . Per Dr Tracie Harrier, patient is in need of higher level of care due to hemoptysis. Patient is aware and verbalizes understanding of plan of care.  Vitals:   09/09/23 1747  Pulse: 95  Resp: 18  Temp: 98 F (36.7 C)  SpO2: 98%

## 2023-09-09 NOTE — ED Triage Notes (Signed)
Pt to ED POV from home. Pt states she found mold in her apartment last week and has been coughing up blood since. Pt states blood is dark in color and comes in small clots. Pt c/o central tight chest pain. Pt c/o coughing. Pt also endorses N/V x3 days. Pt states she had CXR done earlier today at [facility] that came back normal but came to ER d/t s/s continuing.

## 2023-09-09 NOTE — ED Provider Triage Note (Signed)
Emergency Medicine Provider Triage Evaluation Note  Tricia Welch , a 22 y.o. female  was evaluated in triage.  Pt complains of coughing up blood for the past couple days. She reports coughing for the past several weeks.  Patient found out recently that her apartment has black mold and workers came to remove it.  Since then, she has been having increased cough and coughing up blood clots.  She went to urgent care and advised to come here for further evaluation.  Review of Systems  Positive: Coughing up blood Negative: Chest pain  Physical Exam  BP 110/85 (BP Location: Left Arm)   Pulse (!) 109   Temp 98.4 F (36.9 C)   Resp 20   SpO2 100%  Gen:   Awake, no distress   Resp:  Normal effort, coughing continuously MSK:   Moves extremities without difficulty  Other:    Medical Decision Making  Medically screening exam initiated at 9:25 PM.  Appropriate orders placed.  Tricia Welch was informed that the remainder of the evaluation will be completed by another provider, this initial triage assessment does not replace that evaluation, and the importance of remaining in the ED until their evaluation is complete.    Tricia Marion, PA-C 09/09/23 2127

## 2023-09-10 MED ORDER — ALBUTEROL SULFATE HFA 108 (90 BASE) MCG/ACT IN AERS
2.0000 | INHALATION_SPRAY | RESPIRATORY_TRACT | Status: DC | PRN
  Administered 2023-09-10: 2 via RESPIRATORY_TRACT
  Filled 2023-09-10: qty 6.7

## 2023-09-10 MED ORDER — PREDNISONE 20 MG PO TABS
60.0000 mg | ORAL_TABLET | Freq: Once | ORAL | Status: AC
  Administered 2023-09-10: 60 mg via ORAL
  Filled 2023-09-10: qty 3

## 2023-09-10 MED ORDER — IPRATROPIUM-ALBUTEROL 0.5-2.5 (3) MG/3ML IN SOLN
3.0000 mL | Freq: Once | RESPIRATORY_TRACT | Status: AC
  Administered 2023-09-10: 3 mL via RESPIRATORY_TRACT
  Filled 2023-09-10: qty 3

## 2023-09-10 MED ORDER — PREDNISONE 20 MG PO TABS: 40.0000 mg | ORAL_TABLET | Freq: Every day | ORAL | 0 refills | Status: DC

## 2023-09-10 MED ORDER — AZITHROMYCIN 250 MG PO TABS: 250.0000 mg | ORAL_TABLET | Freq: Every day | ORAL | 0 refills | Status: DC

## 2023-09-10 NOTE — Discharge Instructions (Signed)
Please take medications as prescribed.  Please follow-up with the pulmonologist or your primary care doctor.  Return for new or worsening symptoms.

## 2023-09-10 NOTE — ED Provider Notes (Signed)
[facility] CARE CENTER   [id number] 09/09/23 Arrival Time: 1723  ASSESSMENT & PLAN:  1. Coughing up blood    Given description of coughing clots of blood, cannot r/o PE. To ED for further evaluation. By POV; stable upon discharge.  I have personally viewed and independently interpreted the imaging studies ordered this visit. CXR: no acute changes.   Recommend:  Follow-up Information     Go to  [facility] Emergency Department at [facility].   Specialty: Emergency Medicine Contact information: [address] [place] [address] [phone]                Reviewed expectations re: course of current medical issues. Questions answered. Outlined signs and symptoms indicating need for more acute intervention. Patient verbalized understanding. After Visit Summary given.  SUBJECTIVE: History from: patient.  Tricia Welch is a 22 y.o. female Reports Sore throat, post nasal drip, coughing up blood clots, described as the size of a nickel. Patient reports resources came and cut areas of mold out of her apartment.  Reports chest is sore, particularly with deep breathing.     Patient has taken benadryl.  Has taken alka seltzer cold and flu medicines   Social History   Tobacco Use  Smoking Status Never   Passive exposure: Never  Smokeless Tobacco Never     OBJECTIVE:  Vitals:   09/09/23 1747  Pulse: 95  Resp: 18  Temp: 98 F (36.7 C)  TempSrc: Oral  SpO2: 98%     General appearance: alert; NAD HEENT: [place]; AT; with mild nasal congestion Neck: supple without LAD Cv: RRR without murmer Lungs: unlabored respirations, CTAB Skin: warm and dry Psychological: alert and cooperative; normal mood and affect  Imaging:   DG Chest 2 View  Result Date: 09/09/2023 CLINICAL DATA:  Short of breath, mold exposure EXAM: CHEST - 2 VIEW COMPARISON:  None Available. FINDINGS: The heart size and mediastinal contours are within normal limits. Both  lungs are clear. The visualized skeletal structures are unremarkable. IMPRESSION: No active cardiopulmonary disease. Electronically Signed   By: Sharlet Salina M.D.   On: 09/09/2023 20:11    No Known Allergies  Past Medical History:  Diagnosis Date   Anemia    Family History  Problem Relation Age of Onset   Diabetes Maternal Grandmother    Diabetes Maternal Grandfather    Diabetes Paternal Grandmother    Diabetes Paternal Grandfather    Social History   Socioeconomic History   Marital status: Single    Spouse name: Not on file   Number of children: Not on file   Years of education: Not on file   Highest education level: Some college, no degree  Occupational History   Not on file  Tobacco Use   Smoking status: Never    Passive exposure: Never   Smokeless tobacco: Never  Vaping Use   Vaping status: Never Used  Substance and Sexual Activity   Alcohol use: Never   Drug use: Never   Sexual activity: Yes    Birth control/protection: Injection, Condom  Other Topics Concern   Not on file  Social History Narrative   Not on file   Social Determinants of Health   Financial Resource Strain: Low Risk  (02/02/2023)   [facility]   Overall Financial Resource Strain (CARDIA)    Difficulty of Paying Living Expenses: Not very hard  Food Insecurity: No Food Insecurity (02/02/2023)  [facility]   Hunger Vital Sign    Worried About Running Out of Food in the Last Year: Never true    Ran Out of Food in the Last Year: Never true  Transportation Needs: No Transportation Needs (02/02/2023)   [facility]   PRAPARE - Administrator, Civil Service (Medical): No    Lack of Transportation (Non-Medical): No  Physical Activity: Unknown (06/18/2022)   Exercise Vital Sign     Days of Exercise per Week: Patient declined    Minutes of Exercise per Session: Not on file  Stress: No Stress Concern Present (06/18/2022)   [employer] of Occupational Health - Occupational Stress Questionnaire    Feeling of Stress : Not at all  Social Connections: Socially Integrated (06/18/2022)   Social Connection and Isolation Panel [NHANES]    Frequency of Communication with Friends and Family: More than three times a week    Frequency of Social Gatherings with Friends and Family: More than three times a week    Attends Religious Services: More than 4 times per year    Active Member of [employer] or Organizations: Yes    Attends Banker Meetings: More than 4 times per year    Marital Status: Living with partner  Intimate Partner Violence: Not At Risk (02/02/2023)   [facility]   Humiliation, Afraid, Rape, and Kick questionnaire    Fear of Current or Ex-Partner: No    Emotionally Abused: No    Physically Abused: No    Sexually Abused: No             Mardella Layman, MD 09/10/23 1004

## 2023-09-10 NOTE — ED Provider Notes (Signed)
[facility]-EMERGENCY DEPT [facility] Emergency Department Provider Note MRN:  [id number]  Arrival date & time: 09/10/23     Chief Complaint   Hemoptysis   History of Present Illness   Tricia Welch is a 22 y.o. year-old female presents to the ED with chief complaint of cough.  States that she found some mold in the home.  States that she has been coughing up some blood clots.  She denies fever, chills.  Hasn't had COVID or flu test done.  Spouse is sick with the same symptoms.  States that coughing worsened today.  Has treated her symptoms with an albuterol nebulizer.  History provided by patient.   Review of Systems  Pertinent positive and negative review of systems noted in HPI.    Physical Exam   Vitals:   09/10/23 0215 09/10/23 0315  BP: 108/71 105/69  Pulse: 79 100  Resp: 18   Temp:    SpO2: 100% 100%    CONSTITUTIONAL:  non toxic-appearing, NAD NEURO:  Alert and oriented x 3, CN 3-12 grossly intact EYES:  eyes equal and reactive ENT/NECK:  Supple, no stridor  CARDIO:  normal rate, regular rhythm, appears well-perfused  PULM:  No respiratory distress, CTAB GI/GU:  non-distended,  MSK/SPINE:  No gross deformities, no edema, moves all extremities  SKIN:  no rash, atraumatic   *Additional and/or pertinent findings included in MDM below  Diagnostic and Interventional Summary    EKG Interpretation Date/Time:    Ventricular Rate:    PR Interval:    QRS Duration:    QT Interval:    QTC Calculation:   R Axis:      Text Interpretation:         Labs Reviewed  BASIC METABOLIC PANEL - Abnormal; Notable for the following components:      Result Value   CO2 20 (*)    Glucose, Bld 118 (*)    Creatinine, Ser 1.02 (*)    All other components within normal limits  D-DIMER, QUANTITATIVE  CBC  HCG, SERUM, QUALITATIVE    DG Chest Portable 1 View  Final Result      Medications  albuterol (VENTOLIN HFA) 108 (90 Base) MCG/ACT inhaler 2 puff (has no administration  in time range)  albuterol (PROVENTIL) (2.5 MG/3ML) 0.083% nebulizer solution 2.5 mg (2.5 mg Nebulization Given 09/09/23 2259)  ipratropium-albuterol (DUONEB) 0.5-2.5 (3) MG/3ML nebulizer solution 3 mL (3 mLs Nebulization Given 09/10/23 0316)  predniSONE (DELTASONE) tablet 60 mg (60 mg Oral Given 09/10/23 0314)     Procedures  /  Critical Care Procedures  ED Course and Medical Decision Making  I have reviewed the triage vital signs, the nursing notes, and pertinent available records from the EMR.  Social Determinants Affecting Complexity of Care: Patient has no clinically significant social determinants affecting this chief complaint..   ED Course:    Medical Decision Making Patient here with SOB, chest tightness, and some episodes of hemoptysis following mold exposure at home.  Family member sick with similar.  She states that her lungs feel tight.  She had improvement with a neb.  Will give an additional neb.    She might be having some bronchospasm from allergen and possibly mold.    Her labs and workup here are reassuring.  Normal d-dimer, no hypoxia.  I doubt PE.  She has had improvement after nebs.  Will discharge home with prednisone, z-pak, and inhaler.  She is encouraged to avoid the mold and follow-up with PCP or pulm  for further evaluation.  Risk Prescription drug management.         Consultants: No consultations were needed in caring for this patient.   Treatment and Plan: Emergency department workup does not suggest an emergent condition requiring admission or immediate intervention beyond  what has been performed at this time. The patient is safe for discharge and has  been instructed to return immediately for worsening symptoms, change in  symptoms or any other concerns    Final Clinical Impressions(s) / ED Diagnoses     ICD-10-CM   1. Cough with hemoptysis  R04.2       ED Discharge Orders          Ordered    predniSONE (DELTASONE) 20 MG tablet  Daily         09/10/23 0341    azithromycin (ZITHROMAX) 250 MG tablet  Daily        09/10/23 0341              Discharge Instructions Discussed with and Provided to Patient:     Discharge Instructions      Please take medications as prescribed.  Please follow-up with the pulmonologist or your primary care doctor.  Return for new or worsening symptoms.       Roxy Horseman, PA-C 09/10/23 0342    Sabas Sous, MD 09/10/23 [phone]

## 2023-10-24 ENCOUNTER — Ambulatory Visit (HOSPITAL_COMMUNITY): Payer: Medicaid Other

## 2024-01-21 NOTE — Progress Notes (Unsigned)
 01/22/2024 Tricia Welch [id number] 2001-08-19  Referring provider: Earlie Server, PA Primary GI doctor: Dr. Barron Alvine  ASSESSMENT AND PLAN:   Left upper quadrant abdominal pain 09/09/2023 Hgb 13.1 KUB showed normal bowel gas pattern recently with primary care Constant LUQ pain, achy, worse lying down, not associated with food, tylenol did not help, pepto has helped some She has had some nausea, GERD, and bloating.  Denies fever, chills, minor weight loss Has had more frequent Bm's but formed, occ dark/black stools, she has been taking pepto but states not associated with it.  - - likely component of chronic abdominal wall pain with pin point tenderness and + carrnett on exam, do salon pas patches, consider injection with sports medicine with location close to left ribe cage - add on pepcid at night, avoid NSAIDS - with melena/GERD/pain will schedule EGD at [facility] I discussed risks of EGD with patient today, including risk of sedation, bleeding or perforation.  Patient provides understanding and gave verbal consent to proceed.  Irregular menstruation Was on depo but stopped July Not on anything for menses Heavy menses with lower AB pain, some discomfort between menses Will check pregnancy test Consider possible endometriosis with description  Screening colon  Age 50 unless change in symptoms  Patient Care Team: Pcp, No as PCP - General  HISTORY OF PRESENT ILLNESS: 23 year old female with history of migraines presents as a new patient for left upper quadrant abdominal pain.  Discussed the use of AI scribe software for clinical note transcription with the patient, who gave verbal consent to proceed.  History of Present Illness   Tricia Welch is a 23 year old female who presents with upper left abdominal pain.  She has been experiencing constant upper left abdominal pain since December, described as an aching sensation that worsens when lying down. Tylenol has not alleviated  the pain, but Pepto Bismol provides some relief. Associated symptoms include increased nausea, bloating, and worsening acid reflux. No fever or chills, but there is a slight, unintentional weight loss of a few pounds. Bowel habits have changed with increased frequency to about three times a day, though stools remain formed. Occasionally, she notices dark black stools, not consistently associated with Pepto Bismol use. She is not on iron supplements.  Her menstrual periods are heavy, and she experiences pain between periods without spotting. She stopped using Depo-Provera in July of the previous year and is not on any other birth control.  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 She has not seen a dermatologist since starting college.  She used to smoke but has since quit and does not consume alcohol.     She  reports that she has never smoked. She has never been exposed to tobacco smoke. She has never used smokeless tobacco. She reports that she does not drink alcohol and does not use drugs.  RELEVANT GI HISTORY, IMAGING AND LABS:  CBC    Component Value Date/Time   WBC 6.1 09/09/2023 2149   RBC 4.28 09/09/2023 2149   HGB 13.1 09/09/2023 2149   HGB 10.2 (L) 04/01/2022 0902   HCT 38.5 09/09/2023 2149   HCT 31.0 (L) 04/01/2022 0902   PLT 248 09/09/2023 2149   PLT 207 04/01/2022 0902   MCV 90.0 09/09/2023 2149   MCV 88 04/01/2022 0902   MCH 30.6 09/09/2023 2149   MCHC 34.0 09/09/2023 2149  RDW 12.4 09/09/2023 2149   RDW 12.1 04/01/2022 0902   LYMPHSABS 1.5 05/11/2022 1853   LYMPHSABS 1.9 02/28/2022 1112   MONOABS 0.3 05/11/2022 1853   EOSABS 0.0 05/11/2022 1853   EOSABS 0.1 02/28/2022 1112   BASOSABS 0.0 05/11/2022 1853   BASOSABS 0.0 02/28/2022 1112   Recent Labs    09/09/23 2149  HGB 13.1    CMP     Component Value  Date/Time   NA 136 09/09/2023 2149   K 3.7 09/09/2023 2149   CL 105 09/09/2023 2149   CO2 20 (L) 09/09/2023 2149   GLUCOSE 118 (H) 09/09/2023 2149   BUN 8 09/09/2023 2149   CREATININE 1.02 (H) 09/09/2023 2149   CALCIUM 9.3 09/09/2023 2149   PROT 5.3 (L) 06/19/2022 1349   ALBUMIN 2.7 (L) 06/19/2022 1349   AST 67 (H) 06/19/2022 1349   ALT 32 06/19/2022 1349   ALKPHOS 265 (H) 06/19/2022 1349   BILITOT 0.3 06/19/2022 1349   GFRNONAA >60 09/09/2023 2149      Latest Ref Rng & Units 06/19/2022    1:49 PM 06/18/2022    7:29 PM 05/11/2022    6:53 PM  Hepatic Function  Total Protein 6.5 - 8.1 g/dL 5.3  6.1  6.2   Albumin 3.5 - 5.0 g/dL 2.7  2.9  2.8   AST 15 - 41 U/L 67  101  22   ALT 0 - 44 U/L 32  41  13   Alk Phosphatase 38 - 126 U/L 265  294  231   Total Bilirubin 0.3 - 1.2 mg/dL 0.3  0.5  0.5       Current Medications:   Current Outpatient Medications (Endocrine & Metabolic):    predniSONE (DELTASONE) 20 MG tablet, Take 2 tablets (40 mg total) by mouth daily.   Current Outpatient Medications (Respiratory):    cetirizine (ZYRTEC ALLERGY) 10 MG tablet, Take 1 tablet (10 mg total) by mouth daily.   promethazine-dextromethorphan (PROMETHAZINE-DM) 6.25-15 MG/5ML syrup, Take 5 mLs by mouth 4 (four) times daily as needed for cough.   pseudoephedrine (SUDAFED) 60 MG tablet, Take 1 tablet (60 mg total) by mouth every 8 (eight) hours as needed for congestion.  Current Outpatient Medications (Analgesics):    Acetaminophen (TYLENOL 8 HOUR PO), Take by mouth.   SUMAtriptan (IMITREX) 50 MG tablet, Take 1 tablet (50 mg total) by mouth every 2 (two) hours as needed for migraine. May repeat in 2 hours if headache persists or recurs.   Current Outpatient Medications (Other):    azithromycin (ZITHROMAX) 250 MG tablet, Take 1 tablet (250 mg total) by mouth daily. Take first 2 tablets together, then 1 every day until finished.   famotidine (PEPCID) 40 MG tablet, Take 1 tablet (40 mg total) by  mouth at bedtime.   nitrofurantoin, macrocrystal-monohydrate, (MACROBID) 100 MG capsule, Take 1 capsule (100 mg total) by mouth 2 (two) times daily.  Medical History:  Past Medical History:  Diagnosis Date   Alopecia    Anemia    GERD (gastroesophageal reflux disease)    Migraine    Scarlet fever    TMJ (dislocation of temporomandibular joint)    Allergies: No Known Allergies   Surgical History:  She  has a past surgical history that includes Tonsillectomy. Family History:  Her family history includes Diabetes in her maternal grandfather, maternal grandmother, paternal grandfather, and paternal grandmother.  REVIEW OF SYSTEMS  : All other systems reviewed and negative except where noted in the History of  Present Illness.  PHYSICAL EXAM: BP 104/72   Pulse (!) 104   Ht 5\' 8"  (1.727 m)   Wt 143 lb (64.9 kg)   BMI 21.74 kg/m  Physical Exam   GENERAL APPEARANCE: Well nourished, in no apparent distress. HEENT: No cervical lymphadenopathy, unremarkable thyroid, sclerae anicteric, conjunctiva pink. RESPIRATORY: Respiratory effort normal, breath sounds equal bilaterally without rales, rhonchi, or wheezing. CARDIO: Regular rate and rhythm with no murmurs, rubs, or gallops, peripheral pulses intact. ABDOMEN: Soft, non-distended, active bowel sounds in all four quadrants, epigastric tenderness, left upper quadrant pain likely muscular with positive Carnett's sign, no tenderness in right upper quadrant, no rebound, no mass appreciated. RECTAL: Declines. MUSCULOSKELETAL: Full range of motion, normal gait, without edema. SKIN: Dry, intact without rashes or lesions. No jaundice. NEURO: Alert, oriented, no focal deficits. PSYCH: Cooperative, normal mood and affect.      Doree Albee, PA-C 9:27 AM

## 2024-01-22 ENCOUNTER — Other Ambulatory Visit: Payer: Self-pay

## 2024-01-22 ENCOUNTER — Ambulatory Visit: Admitting: Physician Assistant

## 2024-01-22 ENCOUNTER — Other Ambulatory Visit

## 2024-01-22 ENCOUNTER — Encounter: Payer: Self-pay | Admitting: Physician Assistant

## 2024-01-22 VITALS — BP 104/72 | HR 104 | Ht 68.0 in | Wt 143.0 lb

## 2024-01-22 DIAGNOSIS — R110 Nausea: Secondary | ICD-10-CM

## 2024-01-22 DIAGNOSIS — R140 Abdominal distension (gaseous): Secondary | ICD-10-CM | POA: Diagnosis not present

## 2024-01-22 DIAGNOSIS — K921 Melena: Secondary | ICD-10-CM

## 2024-01-22 DIAGNOSIS — K219 Gastro-esophageal reflux disease without esophagitis: Secondary | ICD-10-CM

## 2024-01-22 DIAGNOSIS — R1012 Left upper quadrant pain: Secondary | ICD-10-CM

## 2024-01-22 DIAGNOSIS — D72829 Elevated white blood cell count, unspecified: Secondary | ICD-10-CM

## 2024-01-22 DIAGNOSIS — N926 Irregular menstruation, unspecified: Secondary | ICD-10-CM

## 2024-01-22 LAB — COMPREHENSIVE METABOLIC PANEL
ALT: 7 U/L (ref 0–35)
AST: 15 U/L (ref 0–37)
Albumin: 4.9 g/dL (ref 3.5–5.2)
Alkaline Phosphatase: 71 U/L (ref 39–117)
BUN: 11 mg/dL (ref 6–23)
CO2: 25 meq/L (ref 19–32)
Calcium: 9.9 mg/dL (ref 8.4–10.5)
Chloride: 104 meq/L (ref 96–112)
Creatinine, Ser: 0.82 mg/dL (ref 0.40–1.20)
GFR: 102.85 mL/min (ref >60.00)
Glucose, Bld: 78 mg/dL (ref 70–99)
Potassium: 3.9 meq/L (ref 3.5–5.1)
Sodium: 139 meq/L (ref 135–145)
Total Bilirubin: 0.4 mg/dL (ref 0.2–1.2)
Total Protein: 8.3 g/dL (ref 6.0–8.3)

## 2024-01-22 LAB — SEDIMENTATION RATE: Sed Rate: 10 mm/h (ref 0–20)

## 2024-01-22 LAB — HCG, QUANTITATIVE, PREGNANCY: Quantitative HCG: <0.6 m[IU]/mL

## 2024-01-22 LAB — CBC WITH DIFFERENTIAL/PLATELET
Basophils Absolute: 0 10*3/uL (ref 0.0–0.1)
Basophils Relative: 0.5 % (ref 0.0–3.0)
Eosinophils Absolute: 0 10*3/uL (ref 0.0–0.7)
Eosinophils Relative: 1 % (ref 0.0–5.0)
HCT: 42.1 % (ref 36.0–46.0)
Hemoglobin: 14.4 g/dL (ref 12.0–15.0)
Lymphocytes Relative: 68 % — ABNORMAL HIGH (ref 12.0–46.0)
Lymphs Abs: 2 10*3/uL (ref 0.7–4.0)
MCHC: 34.2 g/dL (ref 30.0–36.0)
MCV: 92.2 fl (ref 78.0–100.0)
Monocytes Absolute: 0.2 10*3/uL (ref 0.1–1.0)
Monocytes Relative: 5.5 % (ref 3.0–12.0)
Neutro Abs: 0.7 10*3/uL — ABNORMAL LOW (ref 1.4–7.7)
Neutrophils Relative %: 25 % — ABNORMAL LOW (ref 43.0–77.0)
Platelets: 225 10*3/uL (ref 150.0–400.0)
RBC: 4.57 Mil/uL (ref 3.87–5.11)
RDW: 13 % (ref 11.5–14.6)
WBC: 2.9 10*3/uL — ABNORMAL LOW (ref 4.5–10.5)

## 2024-01-22 LAB — HIGH SENSITIVITY CRP: CRP, High Sensitivity: <0.2 mg/L (ref 0.000–5.000)

## 2024-01-22 MED ORDER — FAMOTIDINE 40 MG PO TABS: 40.0000 mg | ORAL_TABLET | Freq: Every day | ORAL | 0 refills | Status: AC

## 2024-01-22 NOTE — Patient Instructions (Addendum)
 Your provider has requested that you go to the basement level for lab work before leaving today. Press "B" on the elevator. The lab is located at the first door on the left as you exit the elevator.  No aleve, ibuprofen, goody powders, as these are antiinflammatories and can cause inflammation in your stomach, increase bleeding risk and cause ulcers.  You can talk with PCP about alternative pain options.  Can do tyelnol max 3000 mg a day, salon pas patches are over the counter and voltern gel is topical antiinflammatory that is safe.   Please take your proton pump inhibitor medication, pepcid  Please take this medication 30 minutes to 1 hour before meals- this makes it more effective.  Avoid spicy and acidic foods Avoid fatty foods Limit your intake of coffee, tea, alcohol, and carbonated drinks Work to maintain a healthy weight Keep the head of the bed elevated at least 3 inches with blocks or a wedge pillow if you are having any nighttime symptoms Stay upright for 2 hours after eating Avoid meals and snacks three to four hours before bedtime  First do a trial off milk/lactose products if you use them.  Add fiber like benefiber or citracel once a day Increase activity Can do trial of IBGard which is over the counter for AB pain- Take 1-2 capsules once a day for maintence or twice a day during a flare    FODMAP stands for fermentable oligo-, di-, mono-saccharides and polyols (1). These are the scientific terms used to classify groups of carbs that are difficult for our body to digest and that are notorious for triggering digestive symptoms like bloating, gas, loose stools and stomach pain.   You can try low FODMAP diet  - start with eliminating just one column at a time that you feel may be a trigger for you. - the table at the very bottom contains foods that are low in FODMAPs   Sometimes trying to eliminate the FODMAP's from your diet is difficult or tricky, if you are stuggling with  trying to do the elimination diet you can try an enzyme.  There is a food enzymes that you sprinkle in or on your food that helps break down the FODMAP. You can read more about the enzyme by going to this site: https://fodzyme.com/   You have been scheduled for an endoscopy. Please follow written instructions given to you at your visit today.  If you use inhalers (even only as needed), please bring them with you on the day of your procedure.  If you take any of the following medications, they will need to be adjusted prior to your procedure:   DO NOT TAKE 7 DAYS PRIOR TO TEST- Trulicity (dulaglutide) Ozempic, Wegovy (semaglutide) Mounjaro (tirzepatide) Bydureon Bcise (exanatide extended release)  DO NOT TAKE 1 DAY PRIOR TO YOUR TEST Rybelsus (semaglutide) Adlyxin (lixisenatide) Victoza (liraglutide) Byetta (exanatide) ___________________________________________________________________________   You have been scheduled for an abdominal ultrasound at [facility] Radiology (1st floor of hospital) on 01/28/2024 at 10:00am. Please arrive 30 minutes prior to your appointment for registration. Make certain not to have anything to eat or drink 6 hours prior to your appointment. Should you need to reschedule your appointment, please contact radiology at [phone]. This test typically takes about 30 minutes to perform.  I appreciate the opportunity to care for you. Quentin Mulling, PA

## 2024-01-23 LAB — TISSUE TRANSGLUTAMINASE, IGA: (tTG) Ab, IgA: <1 U/mL

## 2024-01-23 LAB — IGA: Immunoglobulin A: 278 mg/dL (ref 47–310)

## 2024-01-25 ENCOUNTER — Telehealth: Payer: Self-pay

## 2024-01-25 NOTE — Telephone Encounter (Signed)
-----   Message from [place] S sent at 01/22/2024  4:51 PM EDT ----- Regarding: approval Her CT scan is approved  Auth# [id number] 01/22/24-02/21/24

## 2024-01-25 NOTE — Telephone Encounter (Signed)
 CT scan scheduled for 01/28/24 at 1 pm.

## 2024-01-25 NOTE — Telephone Encounter (Signed)
 CT scan has been approved. Secure staff message sent to radiology scheduling to contact patient to cancel [place] appt and schedule CTAP instead. Called and spoke with patient, she is aware of the plan. Pt had no concerns at the end of the call.

## 2024-01-28 ENCOUNTER — Ambulatory Visit (HOSPITAL_COMMUNITY)

## 2024-01-28 ENCOUNTER — Ambulatory Visit (HOSPITAL_COMMUNITY)
Admission: RE | Admit: 2024-01-28 | Discharge: 2024-01-28 | Disposition: A | Discharge status: Home / Self Care | Source: Ambulatory Visit | Attending: Physician Assistant | Admitting: Physician Assistant

## 2024-01-28 DIAGNOSIS — D72829 Elevated white blood cell count, unspecified: Secondary | ICD-10-CM | POA: Diagnosis present

## 2024-01-28 DIAGNOSIS — R1012 Left upper quadrant pain: Secondary | ICD-10-CM | POA: Insufficient documentation

## 2024-01-28 MED ORDER — IOHEXOL 300 MG/ML  SOLN
100.0000 mL | Freq: Once | INTRAMUSCULAR | Status: AC | PRN
  Administered 2024-01-28: 100 mL via INTRAVENOUS

## 2024-01-28 MED ORDER — SODIUM CHLORIDE (PF) 0.9 % IJ SOLN
INTRAMUSCULAR | Status: AC
Start: 2024-01-28 — End: ?
  Filled 2024-01-28: qty 50

## 2024-02-04 NOTE — Progress Notes (Signed)
 Agree with the assessment and plan as outlined by Quentin Mulling, PA-C. ? ?Keron Neenan, DO, FACG ? ?

## 2024-02-05 ENCOUNTER — Telehealth: Payer: Self-pay

## 2024-02-05 ENCOUNTER — Other Ambulatory Visit

## 2024-02-05 DIAGNOSIS — D72820 Lymphocytosis (symptomatic): Secondary | ICD-10-CM

## 2024-02-05 DIAGNOSIS — D709 Neutropenia, unspecified: Secondary | ICD-10-CM | POA: Diagnosis not present

## 2024-02-05 LAB — CBC WITH DIFFERENTIAL/PLATELET
Basophils Absolute: 0 10*3/uL (ref 0.0–0.1)
Basophils Relative: 0.4 % (ref 0.0–3.0)
Eosinophils Absolute: 0.1 10*3/uL (ref 0.0–0.7)
Eosinophils Relative: 1.6 % (ref 0.0–5.0)
HCT: 39.2 % (ref 36.0–46.0)
Hemoglobin: 13.3 g/dL (ref 12.0–15.0)
Lymphocytes Relative: 65.5 % — ABNORMAL HIGH (ref 12.0–46.0)
Lymphs Abs: 2.5 10*3/uL (ref 0.7–4.0)
MCHC: 34 g/dL (ref 30.0–36.0)
MCV: 92.2 fl (ref 78.0–100.0)
Monocytes Absolute: 0.3 10*3/uL (ref 0.1–1.0)
Monocytes Relative: 8.1 % (ref 3.0–12.0)
Neutro Abs: 1 10*3/uL — ABNORMAL LOW (ref 1.4–7.7)
Neutrophils Relative %: 25.3 % — ABNORMAL LOW (ref 43.0–77.0)
Platelets: 218 10*3/uL (ref 150.0–400.0)
RBC: 4.25 Mil/uL (ref 3.87–5.11)
RDW: 12.7 % (ref 11.5–14.6)
WBC: 3.8 10*3/uL — ABNORMAL LOW (ref 4.5–10.5)

## 2024-02-05 NOTE — Telephone Encounter (Signed)
 Orders in epic. MyChart message sent to patient with reminder.

## 2024-02-05 NOTE — Telephone Encounter (Signed)
-----   Message from Nurse [place] B sent at 01/22/2024  4:13 PM EDT ----- Regarding: CBC due CBC due - need to order - Quentin Mulling, PA   DX: leukopenia with lymphocytosis and neutropenia

## 2024-02-10 ENCOUNTER — Encounter: Payer: Self-pay | Admitting: Gastroenterology

## 2024-02-10 ENCOUNTER — Telehealth: Payer: Self-pay | Admitting: Gastroenterology

## 2024-02-10 ENCOUNTER — Ambulatory Visit: Admitting: Gastroenterology

## 2024-02-10 VITALS — BP 94/68 | HR 66 | Temp 98.3°F | Resp 14 | Ht 68.0 in | Wt 143.0 lb

## 2024-02-10 DIAGNOSIS — K2950 Unspecified chronic gastritis without bleeding: Secondary | ICD-10-CM

## 2024-02-10 DIAGNOSIS — K3189 Other diseases of stomach and duodenum: Secondary | ICD-10-CM

## 2024-02-10 DIAGNOSIS — K259 Gastric ulcer, unspecified as acute or chronic, without hemorrhage or perforation: Secondary | ICD-10-CM

## 2024-02-10 DIAGNOSIS — K2970 Gastritis, unspecified, without bleeding: Secondary | ICD-10-CM

## 2024-02-10 DIAGNOSIS — R110 Nausea: Secondary | ICD-10-CM

## 2024-02-10 DIAGNOSIS — Z3202 Encounter for pregnancy test, result negative: Secondary | ICD-10-CM

## 2024-02-10 DIAGNOSIS — K319 Disease of stomach and duodenum, unspecified: Secondary | ICD-10-CM | POA: Diagnosis not present

## 2024-02-10 DIAGNOSIS — R1012 Left upper quadrant pain: Secondary | ICD-10-CM

## 2024-02-10 LAB — POCT URINE PREGNANCY: Preg Test, Ur: NEGATIVE

## 2024-02-10 MED ORDER — PANTOPRAZOLE SODIUM 40 MG PO TBEC: DELAYED_RELEASE_TABLET | ORAL | 3 refills | Status: AC

## 2024-02-10 MED ORDER — SUCRALFATE 1 G PO TABS
ORAL_TABLET | ORAL | 0 refills | Status: AC
Start: 2024-02-10 — End: ?

## 2024-02-10 MED ORDER — SUCRALFATE 1 G PO TABS: 1.0000 g | ORAL_TABLET | Freq: Four times a day (QID) | ORAL | Status: DC

## 2024-02-10 MED ORDER — SODIUM CHLORIDE 0.9 % IV SOLN: 500.0000 mL | INTRAVENOUS | Status: AC

## 2024-02-10 NOTE — Progress Notes (Signed)
 Called to room to assist during endoscopic procedure.  Patient ID and intended procedure confirmed with present staff. Received instructions for my participation in the procedure from the performing physician.

## 2024-02-10 NOTE — Telephone Encounter (Signed)
 Spoke with fiance- let me know I resent prescription to [facility] on [address].  Understanding voiced

## 2024-02-10 NOTE — Progress Notes (Signed)
 Vss nad trans to pacu

## 2024-02-10 NOTE — Patient Instructions (Signed)
 Discharge instructions given. Handout on Gastritis. Prescriptions sent to pharmacy. Resume previous medications. YOU HAD AN ENDOSCOPIC PROCEDURE TODAY AT THE [facility]:   Refer to the procedure report that was given to you for any specific questions about what was found during the examination.  If the procedure report does not answer your questions, please call your gastroenterologist to clarify.  If you requested that your care partner not be given the details of your procedure findings, then the procedure report has been included in a sealed envelope for you to review at your convenience later.  YOU SHOULD EXPECT: Some feelings of bloating in the abdomen. Passage of more gas than usual.  Walking can help get rid of the air that was put into your GI tract during the procedure and reduce the bloating. If you had a lower endoscopy (such as a colonoscopy or flexible sigmoidoscopy) you may notice spotting of blood in your stool or on the toilet paper. If you underwent a bowel prep for your procedure, you may not have a normal bowel movement for a few days.  Please Note:  You might notice some irritation and congestion in your nose or some drainage.  This is from the oxygen used during your procedure.  There is no need for concern and it should clear up in a day or so.  SYMPTOMS TO REPORT IMMEDIATELY:   Following upper endoscopy (EGD)  Vomiting of blood or coffee ground material  New chest pain or pain under the shoulder blades  Painful or persistently difficult swallowing  New shortness of breath  Fever of 100F or higher  Black, tarry-looking stools  For urgent or emergent issues, a gastroenterologist can be reached at any hour by calling [phone]. Do not use MyChart messaging for urgent concerns.    DIET:  We do recommend a small meal at first, but then you may proceed to your regular diet.  Drink plenty of fluids but you should avoid alcoholic beverages for 24  hours.  ACTIVITY:  You should plan to take it easy for the rest of today and you should NOT DRIVE or use heavy machinery until tomorrow (because of the sedation medicines used during the test).    FOLLOW UP: Our staff will call the number listed on your records the next business day following your procedure.  We will call around 7:15- 8:00 am to check on you and address any questions or concerns that you may have regarding the information given to you following your procedure. If we do not reach you, we will leave a message.     If any biopsies were taken you will be contacted by phone or by letter within the next 1-3 weeks.  Please call us at [phone] if you have not heard about the biopsies in 3 weeks.    SIGNATURES/CONFIDENTIALITY: You and/or your care partner have signed paperwork which will be entered into your electronic medical record.  These signatures attest to the fact that that the information above on your After Visit Summary has been reviewed and is understood.  Full responsibility of the confidentiality of this discharge information lies with you and/or your care-partner.

## 2024-02-10 NOTE — Telephone Encounter (Signed)
 Inbound call from patient's fiance stating pharmacy did not receive sucralfate prescription after today's procedure. Requesting a call back at [phone]. Please advise, thank you.

## 2024-02-10 NOTE — Progress Notes (Signed)
 GASTROENTEROLOGY PROCEDURE H&P NOTE   Primary Care Physician: Pcp, No    Reason for Procedure:  LUQ pain, nausea, bloating, reflux symptoms  Plan:    EGD  Patient is appropriate for endoscopic procedure(s) in the ambulatory ([facility]) setting.  The nature of the procedure, as well as the risks, benefits, and alternatives were carefully and thoroughly reviewed with the patient. Ample time for discussion and questions allowed. The patient understood, was satisfied, and agreed to proceed.     HPI: Tricia Welch is a 23 y.o. female who presents for EGD for evaluation of LUQ pain, nausea, bloating, and reflux symptoms.  Patient was most recently seen in the Gastroenterology Clinic on 01/22/2024.  No interval change in medical history since that appointment. Please refer to that note for full details regarding GI history and clinical presentation.   Past Medical History:  Diagnosis Date   Alopecia    Anemia    GERD (gastroesophageal reflux disease)    Migraine    Scarlet fever    TMJ (dislocation of temporomandibular joint)     Past Surgical History:  Procedure Laterality Date   TONSILLECTOMY      Prior to Admission medications   Medication Sig Start Date End Date Taking? Authorizing Provider  cetirizine (ZYRTEC ALLERGY) 10 MG tablet Take 1 tablet (10 mg total) by mouth daily. 07/06/23  Yes White, Elizabeth A, PA-C  famotidine (PEPCID) 40 MG tablet Take 1 tablet (40 mg total) by mouth at bedtime. 01/22/24  Yes Doree Albee, PA-C  omeprazole (PRILOSEC) 40 MG capsule Take 40 mg by mouth daily. 10/25/23  Yes [provider]  Acetaminophen (TYLENOL 8 HOUR PO) Take by mouth.    [provider]  pseudoephedrine (SUDAFED) 60 MG tablet Take 1 tablet (60 mg total) by mouth every 8 (eight) hours as needed for congestion. Patient not taking: Reported on 02/10/2024 07/06/23   Landis Martins, PA-C  SUMAtriptan (IMITREX) 50 MG tablet Take 1 tablet (50 mg total) by mouth every 2  (two) hours as needed for migraine. May repeat in 2 hours if headache persists or recurs. 06/01/23   Ward, Tylene Fantasia, PA-C    Current Outpatient Medications  Medication Sig Dispense Refill   cetirizine (ZYRTEC ALLERGY) 10 MG tablet Take 1 tablet (10 mg total) by mouth daily. 30 tablet 1   famotidine (PEPCID) 40 MG tablet Take 1 tablet (40 mg total) by mouth at bedtime. 30 tablet 0   omeprazole (PRILOSEC) 40 MG capsule Take 40 mg by mouth daily.     Acetaminophen (TYLENOL 8 HOUR PO) Take by mouth.     pseudoephedrine (SUDAFED) 60 MG tablet Take 1 tablet (60 mg total) by mouth every 8 (eight) hours as needed for congestion. (Patient not taking: Reported on 02/10/2024) 30 tablet 0   SUMAtriptan (IMITREX) 50 MG tablet Take 1 tablet (50 mg total) by mouth every 2 (two) hours as needed for migraine. May repeat in 2 hours if headache persists or recurs. 10 tablet 0   Current Facility-Administered Medications  Medication Dose Route Frequency Provider Last Rate Last Admin   0.9 %  sodium chloride infusion  500 mL Intravenous Continuous Maelani Yarbro V, DO        Allergies as of 02/10/2024   (No Known Allergies)    Family History  Problem Relation Age of Onset   Diabetes Maternal Grandmother    Diabetes Maternal Grandfather    Diabetes Paternal Grandmother    Diabetes Paternal Grandfather  Colon cancer Neg Hx    Stomach cancer Neg Hx    Esophageal cancer Neg Hx     Social History   Socioeconomic History   Marital status: Single    Spouse name: Not on file   Number of children: 1   Years of education: Not on file   Highest education level: Some college, no degree  Occupational History   Occupation: traffic control  Tobacco Use   Smoking status: Never    Passive exposure: Never   Smokeless tobacco: Never  Vaping Use   Vaping status: Never Used  Substance and Sexual Activity   Alcohol use: Never   Drug use: Never   Sexual activity: Yes    Birth control/protection: Injection,  Condom  Other Topics Concern   Not on file  Social History Narrative   Not on file   Social Drivers of Health   Financial Resource Strain: Low Risk  (02/02/2023)   [facility]   Overall Financial Resource Strain (CARDIA)    Difficulty of Paying Living Expenses: Not very hard  Food Insecurity: No Food Insecurity (02/02/2023)   [facility]   Hunger Vital Sign    Worried About Running Out of Food in the Last Year: Never true    Ran Out of Food in the Last Year: Never true  Transportation Needs: No Transportation Needs (02/02/2023)   [facility]   PRAPARE - Administrator, Civil Service (Medical): No    Lack of Transportation (Non-Medical): No  Physical Activity: Unknown (06/18/2022)   Exercise Vital Sign    Days of Exercise per Week: Patient declined    Minutes of Exercise per Session: Not on file  Stress: No Stress Concern Present (06/18/2022)   [employer] of Occupational Health - Occupational Stress Questionnaire    Feeling of Stress : Not at all  Social Connections: Socially Integrated (06/18/2022)   Social Connection and Isolation Panel [NHANES]    Frequency of Communication with Friends and Family: More than three times a week    Frequency of Social Gatherings with Friends and Family: More than three times a week    Attends Religious Services: More than 4 times per year    Active Member of [employer] or Organizations: Yes    Attends Banker Meetings: More than 4 times per year    Marital Status: Living with partner  Intimate Partner Violence: Not At Risk (02/02/2023)   [facility]   Humiliation, Afraid, Rape, and Kick questionnaire    Fear of Current or Ex-Partner: No     Emotionally Abused: No    Physically Abused: No    Sexually Abused: No    Physical Exam: Vital signs in last 24 hours: @BP  116/66   Pulse 64   Temp 98.3 F (36.8 C)   Ht 5\' 8"  (1.727 m)   Wt 143 lb (64.9 kg)   SpO2 99%   BMI 21.74 kg/m  GEN: NAD EYE: Sclerae anicteric ENT: MMM CV: Non-tachycardic Pulm: CTA b/l GI: Soft, NT/ND NEURO:  Alert & Oriented x 3   Doristine Locks, DO [facility]   02/10/2024 9:53 AM

## 2024-02-10 NOTE — Op Note (Signed)
 [facility] Patient Name: Tricia Welch Procedure Date: 02/10/2024 9:54 AM MRN: [id number] Endoscopist: Doristine Locks , MD, [id number] Age: 23 Referring MD:  Date of Birth: 13-Mar-2001 Gender: Female Account #: [id number] Procedure:                Upper GI endoscopy Indications:              Abdominal pain in the left upper quadrant, Nausea Medicines:                Monitored Anesthesia Care Procedure:                Pre-Anesthesia Assessment:                           - Prior to the procedure, a History and Physical                            was performed, and patient medications and                            allergies were reviewed. The patient's tolerance of                            previous anesthesia was also reviewed. The risks                            and benefits of the procedure and the sedation                            options and risks were discussed with the patient.                            All questions were answered, and informed consent                            was obtained. Prior Anticoagulants: The patient has                            taken no anticoagulant or antiplatelet agents. ASA                            Grade Assessment: II - A patient with mild systemic                            disease. After reviewing the risks and benefits,                            the patient was deemed in satisfactory condition to                            undergo the procedure.                           After obtaining informed consent, the endoscope was  passed under direct vision. Throughout the                            procedure, the patient's blood pressure, pulse, and                            oxygen saturations were monitored continuously. The                            GIF [id number] #[id number] was introduced through the                            mouth, and advanced to the third part of duodenum.                            The  upper GI endoscopy was accomplished without                            difficulty. The patient tolerated the procedure                            well. Scope In: Scope Out: Findings:                 The examined esophagus was normal.                           The Z-line was regular and was found 38 cm from the                            incisors.                           Two non-bleeding superficial gastric ulcers with no                            stigmata of bleeding were found in the gastric                            antrum. The largest lesion was 5 mm in largest                            dimension. Biopsies were taken with a cold forceps                            for histology. Estimated blood loss was minimal.                           The gastric fundus, gastric body and incisura were                            normal. Biopsies were taken with a cold forceps for  Helicobacter pylori testing. Estimated blood loss                            was minimal.                           The examined duodenum was normal. Complications:            No immediate complications. Estimated Blood Loss:     Estimated blood loss was minimal. Impression:               - Normal esophagus.                           - Z-line regular, 38 cm from the incisors.                           - Non-bleeding gastric ulcers with no stigmata of                            bleeding. Biopsied.                           - Normal gastric fundus, gastric body and incisura.                            Biopsied.                           - Normal examined duodenum. Recommendation:           - Patient has a contact number available for                            emergencies. The signs and symptoms of potential                            delayed complications were discussed with the                            patient. Return to normal activities tomorrow.                            Written  discharge instructions were provided to the                            patient.                           - Advance diet as tolerated.                           - Increase Protonix (pantoprazole) to 40 mg PO BID                            for 6 weeks to promote mucosal healing of gastric  ulcers, then can reduce to 40 mg daily, and                            potentially tireate off if no further need for                            chronic acid suppression therapy.                           - Use sucralfate tablets 1 gram PO QID for 2 weeks.                           - Await pathology results. Doristine Locks, MD 02/10/2024 10:14:37 AM

## 2024-02-11 ENCOUNTER — Telehealth: Payer: Self-pay | Admitting: *Deleted

## 2024-02-11 NOTE — Telephone Encounter (Signed)
  Follow up Call-     02/10/2024    8:55 AM  Call back number  Post procedure Call Back phone  # [phone]  Permission to leave phone message Yes   Left message to call back if any questions or concerns

## 2024-02-12 LAB — SURGICAL PATHOLOGY

## 2024-02-13 ENCOUNTER — Encounter: Payer: Self-pay | Admitting: Gastroenterology

## 2024-02-23 ENCOUNTER — Encounter: Payer: Self-pay | Admitting: Gastroenterology

## 2024-03-15 ENCOUNTER — Encounter: Payer: Self-pay | Admitting: Obstetrics and Gynecology
